# Patient Record
Sex: Male | Born: 1935 | Race: White | Hispanic: No | Marital: Married | Smoking: Former smoker
Health system: Southern US, Community
[De-identification: ages and names within clinical notes are randomized; demographics above are authoritative.]

## PROBLEM LIST (undated history)

## (undated) DIAGNOSIS — E785 Hyperlipidemia, unspecified: Secondary | ICD-10-CM

## (undated) DIAGNOSIS — K469 Unspecified abdominal hernia without obstruction or gangrene: Secondary | ICD-10-CM

## (undated) DIAGNOSIS — Z5189 Encounter for other specified aftercare: Secondary | ICD-10-CM

## (undated) DIAGNOSIS — I2699 Other pulmonary embolism without acute cor pulmonale: Secondary | ICD-10-CM

## (undated) DIAGNOSIS — M1990 Unspecified osteoarthritis, unspecified site: Secondary | ICD-10-CM

## (undated) DIAGNOSIS — R9439 Abnormal result of other cardiovascular function study: Principal | ICD-10-CM

## (undated) DIAGNOSIS — Z9289 Personal history of other medical treatment: Secondary | ICD-10-CM

## (undated) DIAGNOSIS — C641 Malignant neoplasm of right kidney, except renal pelvis: Secondary | ICD-10-CM

## (undated) DIAGNOSIS — I10 Essential (primary) hypertension: Secondary | ICD-10-CM

## (undated) HISTORY — PX: HERNIA REPAIR: SHX51

## (undated) HISTORY — DX: Malignant neoplasm of right kidney, except renal pelvis: C64.1

## (undated) HISTORY — PX: DOPPLER ECHOCARDIOGRAPHY: SHX263

## (undated) HISTORY — DX: Unspecified abdominal hernia without obstruction or gangrene: K46.9

## (undated) HISTORY — PX: LUNG LOBECTOMY: SHX167

## (undated) HISTORY — PX: OTHER SURGICAL HISTORY: SHX169

## (undated) HISTORY — PX: TOTAL KNEE ARTHROPLASTY: SHX125

## (undated) HISTORY — DX: Essential (primary) hypertension: I10

## (undated) HISTORY — DX: Encounter for other specified aftercare: Z51.89

## (undated) HISTORY — DX: Unspecified osteoarthritis, unspecified site: M19.90

## (undated) HISTORY — DX: Hyperlipidemia, unspecified: E78.5

## (undated) HISTORY — DX: Personal history of other medical treatment: Z92.89

## (undated) HISTORY — PX: BOWEL RESECTION: SHX1257

## (undated) HISTORY — DX: Other pulmonary embolism without acute cor pulmonale: I26.99

## (undated) HISTORY — PX: CHOLECYSTECTOMY: SHX55

## (undated) HISTORY — DX: Abnormal result of other cardiovascular function study: R94.39

---

## 1994-02-22 DIAGNOSIS — C641 Malignant neoplasm of right kidney, except renal pelvis: Secondary | ICD-10-CM

## 1994-02-22 HISTORY — DX: Malignant neoplasm of right kidney, except renal pelvis: C64.1

## 1994-02-22 HISTORY — PX: NEPHRECTOMY RADICAL: SUR878

## 1999-06-03 ENCOUNTER — Encounter: Payer: Self-pay | Admitting: *Deleted

## 1999-06-03 ENCOUNTER — Encounter: Admission: RE | Admit: 1999-06-03 | Discharge: 1999-06-03 | Payer: Self-pay | Admitting: *Deleted

## 1999-06-10 ENCOUNTER — Encounter: Payer: Self-pay | Admitting: Family Medicine

## 1999-06-10 ENCOUNTER — Encounter: Admission: RE | Admit: 1999-06-10 | Discharge: 1999-06-10 | Payer: Self-pay | Admitting: Family Medicine

## 2000-04-29 ENCOUNTER — Encounter: Payer: Self-pay | Admitting: *Deleted

## 2000-04-29 ENCOUNTER — Encounter: Admission: RE | Admit: 2000-04-29 | Discharge: 2000-04-29 | Payer: Self-pay | Admitting: Pediatrics

## 2001-01-09 ENCOUNTER — Encounter: Admission: RE | Admit: 2001-01-09 | Discharge: 2001-01-09 | Payer: Self-pay | Admitting: Nephrology

## 2001-01-09 ENCOUNTER — Encounter: Payer: Self-pay | Admitting: Nephrology

## 2001-01-23 ENCOUNTER — Ambulatory Visit (HOSPITAL_COMMUNITY): Admission: RE | Admit: 2001-01-23 | Discharge: 2001-01-23 | Payer: Self-pay | Admitting: Nephrology

## 2001-01-24 ENCOUNTER — Encounter: Admission: RE | Admit: 2001-01-24 | Discharge: 2001-01-24 | Payer: Self-pay | Admitting: Psychiatry

## 2001-01-24 ENCOUNTER — Encounter: Payer: Self-pay | Admitting: Psychiatry

## 2001-05-05 ENCOUNTER — Encounter: Payer: Self-pay | Admitting: *Deleted

## 2001-05-05 ENCOUNTER — Encounter: Admission: RE | Admit: 2001-05-05 | Discharge: 2001-05-05 | Payer: Self-pay | Admitting: *Deleted

## 2001-06-09 ENCOUNTER — Encounter: Payer: Self-pay | Admitting: Emergency Medicine

## 2001-06-09 ENCOUNTER — Inpatient Hospital Stay (HOSPITAL_COMMUNITY): Admission: EM | Admit: 2001-06-09 | Discharge: 2001-06-22 | Payer: Self-pay | Admitting: Emergency Medicine

## 2001-06-09 ENCOUNTER — Encounter (INDEPENDENT_AMBULATORY_CARE_PROVIDER_SITE_OTHER): Payer: Self-pay | Admitting: Specialist

## 2001-06-18 ENCOUNTER — Encounter: Payer: Self-pay | Admitting: Surgery

## 2001-06-19 ENCOUNTER — Encounter: Payer: Self-pay | Admitting: Surgery

## 2001-06-23 ENCOUNTER — Encounter: Admission: RE | Admit: 2001-06-23 | Discharge: 2001-06-23 | Payer: Self-pay | Admitting: *Deleted

## 2001-07-18 ENCOUNTER — Inpatient Hospital Stay (HOSPITAL_COMMUNITY): Admission: EM | Admit: 2001-07-18 | Discharge: 2001-07-24 | Payer: Self-pay | Admitting: Emergency Medicine

## 2001-07-19 ENCOUNTER — Encounter: Payer: Self-pay | Admitting: Gastroenterology

## 2001-07-22 ENCOUNTER — Encounter: Payer: Self-pay | Admitting: Surgery

## 2001-11-29 ENCOUNTER — Inpatient Hospital Stay (HOSPITAL_COMMUNITY): Admission: RE | Admit: 2001-11-29 | Discharge: 2001-12-04 | Payer: Self-pay | Admitting: *Deleted

## 2001-11-29 ENCOUNTER — Encounter (INDEPENDENT_AMBULATORY_CARE_PROVIDER_SITE_OTHER): Payer: Self-pay | Admitting: Specialist

## 2002-06-06 ENCOUNTER — Inpatient Hospital Stay (HOSPITAL_COMMUNITY): Admission: RE | Admit: 2002-06-06 | Discharge: 2002-06-11 | Payer: Self-pay | Admitting: *Deleted

## 2002-06-10 ENCOUNTER — Encounter: Payer: Self-pay | Admitting: General Surgery

## 2002-06-14 ENCOUNTER — Ambulatory Visit (HOSPITAL_COMMUNITY): Admission: RE | Admit: 2002-06-14 | Discharge: 2002-06-14 | Payer: Self-pay | Admitting: *Deleted

## 2002-06-25 ENCOUNTER — Encounter: Payer: Self-pay | Admitting: *Deleted

## 2002-06-25 ENCOUNTER — Inpatient Hospital Stay (HOSPITAL_COMMUNITY): Admission: EM | Admit: 2002-06-25 | Discharge: 2002-06-29 | Payer: Self-pay | Admitting: *Deleted

## 2002-06-27 ENCOUNTER — Encounter: Payer: Self-pay | Admitting: Internal Medicine

## 2002-07-05 ENCOUNTER — Ambulatory Visit (HOSPITAL_COMMUNITY): Admission: RE | Admit: 2002-07-05 | Discharge: 2002-07-05 | Payer: Self-pay | Admitting: *Deleted

## 2002-07-12 ENCOUNTER — Ambulatory Visit (HOSPITAL_COMMUNITY): Admission: RE | Admit: 2002-07-12 | Discharge: 2002-07-12 | Payer: Self-pay | Admitting: *Deleted

## 2002-07-19 ENCOUNTER — Ambulatory Visit (HOSPITAL_COMMUNITY): Admission: RE | Admit: 2002-07-19 | Discharge: 2002-07-19 | Payer: Self-pay | Admitting: *Deleted

## 2002-11-28 ENCOUNTER — Encounter (INDEPENDENT_AMBULATORY_CARE_PROVIDER_SITE_OTHER): Payer: Self-pay | Admitting: Specialist

## 2002-11-28 ENCOUNTER — Ambulatory Visit (HOSPITAL_COMMUNITY): Admission: RE | Admit: 2002-11-28 | Discharge: 2002-11-28 | Payer: Self-pay | Admitting: Gastroenterology

## 2003-04-29 ENCOUNTER — Encounter: Admission: RE | Admit: 2003-04-29 | Discharge: 2003-04-29 | Payer: Self-pay | Admitting: Specialist

## 2003-06-05 ENCOUNTER — Encounter: Admission: RE | Admit: 2003-06-05 | Discharge: 2003-06-05 | Payer: Self-pay | Admitting: Specialist

## 2004-04-03 ENCOUNTER — Encounter: Admission: RE | Admit: 2004-04-03 | Discharge: 2004-04-03 | Payer: Self-pay | Admitting: Orthopaedic Surgery

## 2004-05-29 ENCOUNTER — Ambulatory Visit (HOSPITAL_COMMUNITY): Admission: RE | Admit: 2004-05-29 | Discharge: 2004-05-29 | Payer: Self-pay | Admitting: Family Medicine

## 2004-07-28 ENCOUNTER — Inpatient Hospital Stay (HOSPITAL_COMMUNITY): Admission: RE | Admit: 2004-07-28 | Discharge: 2004-08-04 | Payer: Self-pay | Admitting: Specialist

## 2004-07-28 ENCOUNTER — Encounter (INDEPENDENT_AMBULATORY_CARE_PROVIDER_SITE_OTHER): Payer: Self-pay | Admitting: *Deleted

## 2004-07-28 ENCOUNTER — Ambulatory Visit: Payer: Self-pay | Admitting: Physical Medicine & Rehabilitation

## 2005-05-19 ENCOUNTER — Ambulatory Visit (HOSPITAL_COMMUNITY): Admission: RE | Admit: 2005-05-19 | Discharge: 2005-05-19 | Payer: Self-pay | Admitting: *Deleted

## 2005-11-07 ENCOUNTER — Inpatient Hospital Stay (HOSPITAL_COMMUNITY): Admission: EM | Admit: 2005-11-07 | Discharge: 2005-11-12 | Payer: Self-pay | Admitting: Emergency Medicine

## 2005-11-07 ENCOUNTER — Encounter (INDEPENDENT_AMBULATORY_CARE_PROVIDER_SITE_OTHER): Payer: Self-pay | Admitting: *Deleted

## 2005-11-20 ENCOUNTER — Inpatient Hospital Stay (HOSPITAL_COMMUNITY): Admission: EM | Admit: 2005-11-20 | Discharge: 2005-11-22 | Payer: Self-pay | Admitting: Emergency Medicine

## 2006-06-06 ENCOUNTER — Encounter: Admission: RE | Admit: 2006-06-06 | Discharge: 2006-06-06 | Payer: Self-pay | Admitting: Gastroenterology

## 2007-10-19 ENCOUNTER — Inpatient Hospital Stay (HOSPITAL_COMMUNITY): Admission: RE | Admit: 2007-10-19 | Discharge: 2007-10-23 | Payer: Self-pay | Admitting: Specialist

## 2007-10-27 ENCOUNTER — Ambulatory Visit: Payer: Self-pay | Admitting: Cardiology

## 2007-10-27 ENCOUNTER — Ambulatory Visit: Admission: RE | Admit: 2007-10-27 | Discharge: 2007-10-27 | Payer: Self-pay | Admitting: Specialist

## 2007-10-27 ENCOUNTER — Inpatient Hospital Stay (HOSPITAL_COMMUNITY): Admission: EM | Admit: 2007-10-27 | Discharge: 2007-11-01 | Payer: Self-pay | Admitting: Family Medicine

## 2007-10-27 ENCOUNTER — Ambulatory Visit: Payer: Self-pay | Admitting: Vascular Surgery

## 2007-10-27 ENCOUNTER — Encounter (INDEPENDENT_AMBULATORY_CARE_PROVIDER_SITE_OTHER): Payer: Self-pay | Admitting: Specialist

## 2007-10-31 ENCOUNTER — Encounter (INDEPENDENT_AMBULATORY_CARE_PROVIDER_SITE_OTHER): Payer: Self-pay | Admitting: Internal Medicine

## 2007-11-07 ENCOUNTER — Emergency Department (HOSPITAL_COMMUNITY): Admission: EM | Admit: 2007-11-07 | Discharge: 2007-11-07 | Payer: Self-pay | Admitting: Emergency Medicine

## 2007-11-07 ENCOUNTER — Encounter (INDEPENDENT_AMBULATORY_CARE_PROVIDER_SITE_OTHER): Payer: Self-pay | Admitting: Specialist

## 2007-11-09 ENCOUNTER — Ambulatory Visit: Payer: Self-pay | Admitting: *Deleted

## 2007-11-09 ENCOUNTER — Ambulatory Visit: Admission: RE | Admit: 2007-11-09 | Discharge: 2007-11-09 | Payer: Self-pay | Admitting: Specialist

## 2007-11-09 ENCOUNTER — Encounter (INDEPENDENT_AMBULATORY_CARE_PROVIDER_SITE_OTHER): Payer: Self-pay | Admitting: Specialist

## 2007-11-10 ENCOUNTER — Ambulatory Visit: Payer: Self-pay | Admitting: Vascular Surgery

## 2009-03-07 ENCOUNTER — Ambulatory Visit: Payer: Self-pay | Admitting: Surgery

## 2009-03-07 ENCOUNTER — Ambulatory Visit: Admission: RE | Admit: 2009-03-07 | Discharge: 2009-03-07 | Payer: Self-pay | Admitting: Specialist

## 2009-03-07 ENCOUNTER — Encounter (INDEPENDENT_AMBULATORY_CARE_PROVIDER_SITE_OTHER): Payer: Self-pay | Admitting: Specialist

## 2009-03-10 ENCOUNTER — Encounter: Admission: RE | Admit: 2009-03-10 | Discharge: 2009-03-10 | Payer: Self-pay | Admitting: Gastroenterology

## 2009-03-13 ENCOUNTER — Ambulatory Visit (HOSPITAL_COMMUNITY): Admission: RE | Admit: 2009-03-13 | Discharge: 2009-03-13 | Payer: Self-pay | Admitting: Gastroenterology

## 2009-03-17 ENCOUNTER — Encounter: Admission: RE | Admit: 2009-03-17 | Discharge: 2009-03-17 | Payer: Self-pay | Admitting: Gastroenterology

## 2009-04-10 ENCOUNTER — Encounter (HOSPITAL_COMMUNITY): Admission: RE | Admit: 2009-04-10 | Discharge: 2009-06-24 | Payer: Self-pay | Admitting: Specialist

## 2009-06-30 ENCOUNTER — Inpatient Hospital Stay (HOSPITAL_COMMUNITY): Admission: AD | Admit: 2009-06-30 | Discharge: 2009-07-02 | Payer: Self-pay | Admitting: Surgery

## 2009-06-30 ENCOUNTER — Encounter (INDEPENDENT_AMBULATORY_CARE_PROVIDER_SITE_OTHER): Payer: Self-pay | Admitting: Surgery

## 2009-09-18 ENCOUNTER — Encounter: Admission: RE | Admit: 2009-09-18 | Discharge: 2009-09-18 | Payer: Self-pay | Admitting: Gastroenterology

## 2009-10-22 ENCOUNTER — Encounter: Admission: RE | Admit: 2009-10-22 | Discharge: 2009-10-22 | Payer: Self-pay | Admitting: Internal Medicine

## 2009-10-22 ENCOUNTER — Other Ambulatory Visit: Admission: RE | Admit: 2009-10-22 | Discharge: 2009-10-22 | Payer: Self-pay | Admitting: Interventional Radiology

## 2010-02-09 ENCOUNTER — Inpatient Hospital Stay (HOSPITAL_COMMUNITY): Admission: EM | Admit: 2010-02-09 | Discharge: 2010-02-11 | Payer: Self-pay | Source: Home / Self Care

## 2010-03-14 ENCOUNTER — Encounter: Payer: Self-pay | Admitting: Orthopaedic Surgery

## 2010-03-15 ENCOUNTER — Encounter: Payer: Self-pay | Admitting: Gastroenterology

## 2010-03-23 LAB — DIFFERENTIAL
Eosinophils Relative: 5 % (ref 0–5)
Lymphs Abs: 2 10*3/uL (ref 0.7–4.0)
Monocytes Absolute: 0.6 10*3/uL (ref 0.1–1.0)
Monocytes Relative: 9 % (ref 3–12)
Neutro Abs: 4.1 10*3/uL (ref 1.7–7.7)

## 2010-03-23 LAB — URINALYSIS, ROUTINE W REFLEX MICROSCOPIC
Hgb urine dipstick: NEGATIVE
Protein, ur: NEGATIVE mg/dL
Specific Gravity, Urine: 1.025 (ref 1.005–1.030)
Urine Glucose, Fasting: NEGATIVE mg/dL
pH: 5 (ref 5.0–8.0)

## 2010-03-23 LAB — BASIC METABOLIC PANEL
BUN: 31 mg/dL — ABNORMAL HIGH (ref 6–23)
CO2: 23 mEq/L (ref 19–32)
Chloride: 109 mEq/L (ref 96–112)
Creatinine, Ser: 1.56 mg/dL — ABNORMAL HIGH (ref 0.4–1.5)
Glucose, Bld: 106 mg/dL — ABNORMAL HIGH (ref 70–99)
Potassium: 4.7 mEq/L (ref 3.5–5.1)

## 2010-03-23 LAB — CBC
Hemoglobin: 15.1 g/dL (ref 13.0–17.0)
MCH: 32.3 pg (ref 26.0–34.0)
MCHC: 33.7 g/dL (ref 30.0–36.0)
MCV: 95.9 fL (ref 78.0–100.0)
RBC: 4.67 MIL/uL (ref 4.22–5.81)

## 2010-03-23 LAB — PROTIME-INR: Prothrombin Time: 14.3 seconds (ref 11.6–15.2)

## 2010-03-25 DIAGNOSIS — I2699 Other pulmonary embolism without acute cor pulmonale: Secondary | ICD-10-CM

## 2010-03-25 HISTORY — DX: Other pulmonary embolism without acute cor pulmonale: I26.99

## 2010-03-31 ENCOUNTER — Inpatient Hospital Stay (HOSPITAL_COMMUNITY)
Admission: RE | Admit: 2010-03-31 | Discharge: 2010-04-10 | DRG: 353 | Disposition: A | Discharge status: Home / Self Care | Payer: Medicare Other | Attending: Surgery | Admitting: Surgery

## 2010-03-31 DIAGNOSIS — N183 Chronic kidney disease, stage 3 unspecified: Secondary | ICD-10-CM | POA: Diagnosis present

## 2010-03-31 DIAGNOSIS — E781 Pure hyperglyceridemia: Secondary | ICD-10-CM | POA: Diagnosis present

## 2010-03-31 DIAGNOSIS — E669 Obesity, unspecified: Secondary | ICD-10-CM | POA: Diagnosis present

## 2010-03-31 DIAGNOSIS — M109 Gout, unspecified: Secondary | ICD-10-CM | POA: Diagnosis present

## 2010-03-31 DIAGNOSIS — K219 Gastro-esophageal reflux disease without esophagitis: Secondary | ICD-10-CM | POA: Diagnosis present

## 2010-03-31 DIAGNOSIS — K560 Paralytic ileus: Secondary | ICD-10-CM | POA: Diagnosis not present

## 2010-03-31 DIAGNOSIS — K432 Incisional hernia without obstruction or gangrene: Principal | ICD-10-CM | POA: Diagnosis present

## 2010-03-31 DIAGNOSIS — D649 Anemia, unspecified: Secondary | ICD-10-CM | POA: Diagnosis not present

## 2010-03-31 DIAGNOSIS — Z86718 Personal history of other venous thrombosis and embolism: Secondary | ICD-10-CM

## 2010-03-31 DIAGNOSIS — I2699 Other pulmonary embolism without acute cor pulmonale: Secondary | ICD-10-CM | POA: Diagnosis not present

## 2010-03-31 DIAGNOSIS — R0902 Hypoxemia: Secondary | ICD-10-CM | POA: Diagnosis not present

## 2010-03-31 DIAGNOSIS — Z9049 Acquired absence of other specified parts of digestive tract: Secondary | ICD-10-CM

## 2010-03-31 DIAGNOSIS — I129 Hypertensive chronic kidney disease with stage 1 through stage 4 chronic kidney disease, or unspecified chronic kidney disease: Secondary | ICD-10-CM | POA: Diagnosis present

## 2010-03-31 DIAGNOSIS — I824Z9 Acute embolism and thrombosis of unspecified deep veins of unspecified distal lower extremity: Secondary | ICD-10-CM | POA: Diagnosis not present

## 2010-03-31 LAB — TYPE AND SCREEN
ABO/RH(D): AB POS
Antibody Screen: NEGATIVE

## 2010-04-03 ENCOUNTER — Inpatient Hospital Stay (HOSPITAL_COMMUNITY): Payer: Medicare Other

## 2010-04-03 LAB — CK TOTAL AND CKMB (NOT AT ARMC)
CK, MB: 1.9 ng/mL (ref 0.3–4.0)
Total CK: 76 U/L (ref 7–232)

## 2010-04-04 ENCOUNTER — Inpatient Hospital Stay (HOSPITAL_COMMUNITY): Payer: Medicare Other

## 2010-04-04 ENCOUNTER — Encounter (HOSPITAL_COMMUNITY): Payer: Self-pay | Admitting: Surgery

## 2010-04-04 DIAGNOSIS — I369 Nonrheumatic tricuspid valve disorder, unspecified: Secondary | ICD-10-CM

## 2010-04-04 LAB — CK TOTAL AND CKMB (NOT AT ARMC)
CK, MB: 1.2 ng/mL (ref 0.3–4.0)
Relative Index: INVALID (ref 0.0–2.5)

## 2010-04-04 LAB — CBC
HCT: 33.9 % — ABNORMAL LOW (ref 39.0–52.0)
MCH: 31.9 pg (ref 26.0–34.0)
MCV: 95 fL (ref 78.0–100.0)
Platelets: 275 10*3/uL (ref 150–400)
RDW: 14.1 % (ref 11.5–15.5)
WBC: 9.1 10*3/uL (ref 4.0–10.5)

## 2010-04-04 LAB — BASIC METABOLIC PANEL
BUN: 18 mg/dL (ref 6–23)
CO2: 24 mEq/L (ref 19–32)
Calcium: 9.6 mg/dL (ref 8.4–10.5)
Chloride: 105 mEq/L (ref 96–112)
Creatinine, Ser: 1.83 mg/dL — ABNORMAL HIGH (ref 0.4–1.5)

## 2010-04-04 LAB — PROTIME-INR
INR: 1.17 (ref 0.00–1.49)
Prothrombin Time: 15.1 seconds (ref 11.6–15.2)

## 2010-04-04 LAB — TROPONIN I: Troponin I: 0.08 ng/mL — ABNORMAL HIGH (ref 0.00–0.06)

## 2010-04-04 LAB — BRAIN NATRIURETIC PEPTIDE: Pro B Natriuretic peptide (BNP): 30 pg/mL (ref 0.0–100.0)

## 2010-04-04 LAB — D-DIMER, QUANTITATIVE: D-Dimer, Quant: 3.71 ug/mL-FEU — ABNORMAL HIGH (ref 0.00–0.48)

## 2010-04-04 MED ORDER — XENON XE 133 GAS
9.0000 | GAS_FOR_INHALATION | Freq: Once | RESPIRATORY_TRACT | Status: AC | PRN
  Administered 2010-04-04: 9 via RESPIRATORY_TRACT

## 2010-04-04 MED ORDER — TECHNETIUM TO 99M ALBUMIN AGGREGATED
4.0000 | Freq: Once | INTRAVENOUS | Status: AC | PRN
  Administered 2010-04-04: 4 via INTRAVENOUS

## 2010-04-05 LAB — CBC
MCH: 32.7 pg (ref 26.0–34.0)
MCHC: 34.7 g/dL (ref 30.0–36.0)
Platelets: 264 10*3/uL (ref 150–400)
RBC: 3.49 MIL/uL — ABNORMAL LOW (ref 4.22–5.81)

## 2010-04-05 LAB — HEPARIN LEVEL (UNFRACTIONATED): Heparin Unfractionated: 0.1 IU/mL — ABNORMAL LOW (ref 0.30–0.70)

## 2010-04-06 DIAGNOSIS — R0602 Shortness of breath: Secondary | ICD-10-CM

## 2010-04-06 LAB — COMPREHENSIVE METABOLIC PANEL
BUN: 31 mg/dL — ABNORMAL HIGH (ref 6–23)
CO2: 24 mEq/L (ref 19–32)
Chloride: 106 mEq/L (ref 96–112)
Creatinine, Ser: 1.59 mg/dL — ABNORMAL HIGH (ref 0.4–1.5)
GFR calc non Af Amer: 43 mL/min — ABNORMAL LOW (ref >60)
Glucose, Bld: 133 mg/dL — ABNORMAL HIGH (ref 70–99)
Total Bilirubin: 0.6 mg/dL (ref 0.3–1.2)

## 2010-04-06 LAB — TYPE AND SCREEN
ABO/RH(D): AB POS
Antibody Screen: NEGATIVE

## 2010-04-06 LAB — CBC
MCH: 32.3 pg (ref 26.0–34.0)
MCV: 95.4 fL (ref 78.0–100.0)
Platelets: 339 10*3/uL (ref 150–400)
RDW: 13.9 % (ref 11.5–15.5)
WBC: 9 10*3/uL (ref 4.0–10.5)

## 2010-04-07 LAB — PROTIME-INR: Prothrombin Time: 16.9 seconds — ABNORMAL HIGH (ref 11.6–15.2)

## 2010-04-07 LAB — COMPREHENSIVE METABOLIC PANEL
Alkaline Phosphatase: 46 U/L (ref 39–117)
BUN: 34 mg/dL — ABNORMAL HIGH (ref 6–23)
CO2: 23 mEq/L (ref 19–32)
GFR calc non Af Amer: 41 mL/min — ABNORMAL LOW (ref >60)
Glucose, Bld: 126 mg/dL — ABNORMAL HIGH (ref 70–99)
Potassium: 4 mEq/L (ref 3.5–5.1)
Total Protein: 6.1 g/dL (ref 6.0–8.3)

## 2010-04-07 LAB — HEPARIN LEVEL (UNFRACTIONATED): Heparin Unfractionated: 0.51 IU/mL (ref 0.30–0.70)

## 2010-04-07 LAB — CBC
MCHC: 34 g/dL (ref 30.0–36.0)
RDW: 14 % (ref 11.5–15.5)

## 2010-04-08 LAB — CBC
MCV: 95 fL (ref 78.0–100.0)
Platelets: 297 10*3/uL (ref 150–400)
RBC: 3.39 MIL/uL — ABNORMAL LOW (ref 4.22–5.81)
WBC: 7.8 10*3/uL (ref 4.0–10.5)

## 2010-04-08 LAB — COMPREHENSIVE METABOLIC PANEL
Albumin: 2.6 g/dL — ABNORMAL LOW (ref 3.5–5.2)
BUN: 32 mg/dL — ABNORMAL HIGH (ref 6–23)
Chloride: 111 mEq/L (ref 96–112)
Creatinine, Ser: 1.65 mg/dL — ABNORMAL HIGH (ref 0.4–1.5)
Total Bilirubin: 0.6 mg/dL (ref 0.3–1.2)

## 2010-04-08 LAB — CARDIAC PANEL(CRET KIN+CKTOT+MB+TROPI)
Relative Index: INVALID (ref 0.0–2.5)
Total CK: 41 U/L (ref 7–232)
Troponin I: <0.01 ng/mL (ref 0.00–0.06)

## 2010-04-08 LAB — HEPARIN LEVEL (UNFRACTIONATED): Heparin Unfractionated: 0.68 IU/mL (ref 0.30–0.70)

## 2010-04-08 LAB — PROTIME-INR
INR: 1.55 — ABNORMAL HIGH (ref 0.00–1.49)
Prothrombin Time: 18.8 seconds — ABNORMAL HIGH (ref 11.6–15.2)

## 2010-04-09 LAB — HEPARIN LEVEL (UNFRACTIONATED): Heparin Unfractionated: 0.64 IU/mL (ref 0.30–0.70)

## 2010-04-09 LAB — CBC
MCH: 31.6 pg (ref 26.0–34.0)
MCHC: 33.1 g/dL (ref 30.0–36.0)
Platelets: 323 10*3/uL (ref 150–400)
RBC: 3.45 MIL/uL — ABNORMAL LOW (ref 4.22–5.81)
RDW: 14.1 % (ref 11.5–15.5)

## 2010-04-09 LAB — COMPREHENSIVE METABOLIC PANEL
ALT: 14 U/L (ref 0–53)
Alkaline Phosphatase: 41 U/L (ref 39–117)
BUN: 24 mg/dL — ABNORMAL HIGH (ref 6–23)
CO2: 24 mEq/L (ref 19–32)
Chloride: 112 mEq/L (ref 96–112)
Glucose, Bld: 125 mg/dL — ABNORMAL HIGH (ref 70–99)
Potassium: 3.9 mEq/L (ref 3.5–5.1)
Total Bilirubin: 0.5 mg/dL (ref 0.3–1.2)

## 2010-04-09 LAB — PROTIME-INR
INR: 2.18 — ABNORMAL HIGH (ref 0.00–1.49)
Prothrombin Time: 24.4 seconds — ABNORMAL HIGH (ref 11.6–15.2)

## 2010-04-10 LAB — CBC
MCH: 31.8 pg (ref 26.0–34.0)
MCHC: 32.9 g/dL (ref 30.0–36.0)
RDW: 14.1 % (ref 11.5–15.5)

## 2010-04-10 LAB — HEPARIN LEVEL (UNFRACTIONATED): Heparin Unfractionated: 0.71 IU/mL — ABNORMAL HIGH (ref 0.30–0.70)

## 2010-04-10 LAB — COMPREHENSIVE METABOLIC PANEL
ALT: 15 U/L (ref 0–53)
AST: 17 U/L (ref 0–37)
Calcium: 9.3 mg/dL (ref 8.4–10.5)
Creatinine, Ser: 1.5 mg/dL (ref 0.4–1.5)
GFR calc Af Amer: 56 mL/min — ABNORMAL LOW (ref >60)
GFR calc non Af Amer: 46 mL/min — ABNORMAL LOW (ref >60)
Glucose, Bld: 118 mg/dL — ABNORMAL HIGH (ref 70–99)
Sodium: 141 mEq/L (ref 135–145)
Total Protein: 6 g/dL (ref 6.0–8.3)

## 2010-04-10 LAB — PROTIME-INR: INR: 2.61 — ABNORMAL HIGH (ref 0.00–1.49)

## 2010-04-16 NOTE — Op Note (Signed)
[name], [name]               ACCOUNT NO.:  [id number]  MEDICAL RECORD NO.:  [id number]           PATIENT TYPE:  I  LOCATION:  0005                         FACILITY:  [facility]  PHYSICIAN:  Velora Heckler, MD      DATE OF BIRTH:  [birth year]  DATE OF PROCEDURE:  03/31/2010 DATE OF DISCHARGE:                              OPERATIVE REPORT   PREOPERATIVE DIAGNOSIS:  Recurrent ventral incisional hernia.  POSTOPERATIVE DIAGNOSIS:  Recurrent ventral incisional hernia.  PROCEDURE:  Repair of recurrent ventral incisional hernia with mesh.  SURGEON:  Velora Heckler, MD  ASSISTANT:  Angelia Mould. Derrell Lolling, M.D.  ANESTHESIA:  General.  ESTIMATED BLOOD LOSS:  150 cc.  PREPARATION:  ChloraPrep.  COMPLICATIONS:  None.  INDICATIONS:  The patient is a 76 year old white male well known to our surgical practice.  He had originally presented in 2003 with perforated sigmoid diverticulitis.  He underwent resection and colostomy.  He underwent colostomy closure in October 2003.  The patient developed a ventral incisional hernia which was repaired in 2004 by Dr. Simona Huh.  He again developed a recurrence in 2007 and underwent repair with mesh by Dr. Simona Huh.  The patient had an open cholecystectomy in May 2011 by myself.  The patient was admitted on the surgical service in December 2011 with small-bowel obstruction due to an incarcerated ventral hernia. This resolved with medical management.  The patient now comes to the operating room for repair of recurrent ventral incisional hernia.  BODY OF REPORT:  The procedure was done in OR #6 at the [facility].  The patient was brought to the operating room and placed in the supine position on the operating room table.  Following the administration of general anesthesia, the patient was prepped and draped in the usual strict aseptic fashion.  After ascertaining that an adequate level of anesthesia had been achieved, the patient's  previous midline abdominal scar was excised.  Dissection was carried into the subcutaneous tissues.  Suture material was extracted.  In the epigastrium, the peritoneal cavity was entered.  Midline incision was reopened.  Hernia was reduced.  Adhesions were taken down with sharp dissection.  This midline incision was opened to well below the level of the umbilicus by dividing through the, what appears to be, polyester mesh.  Next, lysis of adhesions to the anterior abdominal wall was performed circumferentially, allowing for complete mobilization of the abdominal wall.  Good hemostasis was achieved.  Subcutaneous skin flaps were developed circumferentially.  Skin flaps were raised widely up and onto the thorax over the lower ribs and laterally to the anterior axillary line bilaterally and inferiorly to well below the level of the umbilicus.  Hernia sac was excised from the subcutaneous tissues on the left.  Previous polyester mesh was excised from the left side of the abdominal wall.  Relaxing incisions were made in the external oblique fascia bilaterally. This allows for good mobilization of the midline structures so that they can be approximated without undue tension.  After assuring that good hemostasis was achieved intra-abdominally and that all packs had been removed, the midline fascial incision  was closed with interrupted #1 Novafil simple sutures.  There was good approximation of the tissues without tension.  Next a 10 inches x 14 inches sheet of polypropylene mesh was utilized to cover the midline fascial closure as well as the bilateral relaxing incisions.  Mesh was trimmed slightly.  It was secured to the anterior abdominal wall with interrupted 0 Novafil sutures.  Mesh approximates the abdominal wall well.  Two 19-French Blake drains were brought in from inferior stab wounds and placed anterior to the mesh in the subcutaneous space.  They were secured to the skin with 0  Novafil sutures.  Subcutaneous tissues were reapproximated in the midline with interrupted 2-0 Vicryl sutures.  Skin was closed with stainless steel staples.  Drains were placed to bulb suction.  Sterile dressings were applied.  A 12-inches abdominal binder was placed around the torso.  The patient was awakened from anesthesia and brought to the recovery room in stable condition.  The patient tolerated the procedure well.     Velora Heckler, MD     TMG/MEDQ  D:  03/31/2010  T:  03/31/2010  Job:  [id number]  cc:   Velora Heckler, MD [address] [place] [address]  Gwen Pounds, MD Fax: [phone]  Everardo All. Madilyn Fireman, M.D. Fax: [phone]  Electronically Signed by Darnell Level MD on 04/16/2010 10:34:43 AM

## 2010-05-04 LAB — URINALYSIS, ROUTINE W REFLEX MICROSCOPIC
Bilirubin Urine: NEGATIVE
Glucose, UA: NEGATIVE mg/dL
Hgb urine dipstick: NEGATIVE
Protein, ur: NEGATIVE mg/dL
Urobilinogen, UA: 0.2 mg/dL (ref 0.0–1.0)

## 2010-05-04 LAB — CBC
Hemoglobin: 14.3 g/dL (ref 13.0–17.0)
MCH: 32.6 pg (ref 26.0–34.0)
MCH: 33.3 pg (ref 26.0–34.0)
MCHC: 34 g/dL (ref 30.0–36.0)
MCV: 95.7 fL (ref 78.0–100.0)
MCV: 97.9 fL (ref 78.0–100.0)
MCV: 99.5 fL (ref 78.0–100.0)
Platelets: 334 10*3/uL (ref 150–400)
Platelets: 385 10*3/uL (ref 150–400)
Platelets: 438 10*3/uL — ABNORMAL HIGH (ref 150–400)
RBC: 4.3 MIL/uL (ref 4.22–5.81)
RBC: 4.91 MIL/uL (ref 4.22–5.81)
RDW: 13.2 % (ref 11.5–15.5)
RDW: 13.5 % (ref 11.5–15.5)
WBC: 15.5 10*3/uL — ABNORMAL HIGH (ref 4.0–10.5)

## 2010-05-04 LAB — BASIC METABOLIC PANEL
BUN: 28 mg/dL — ABNORMAL HIGH (ref 6–23)
CO2: 31 mEq/L (ref 19–32)
CO2: 31 mEq/L (ref 19–32)
Calcium: 9.7 mg/dL (ref 8.4–10.5)
Chloride: 102 mEq/L (ref 96–112)
Chloride: 104 mEq/L (ref 96–112)
Creatinine, Ser: 1.69 mg/dL — ABNORMAL HIGH (ref 0.4–1.5)
Creatinine, Ser: 1.78 mg/dL — ABNORMAL HIGH (ref 0.4–1.5)
GFR calc Af Amer: 46 mL/min — ABNORMAL LOW (ref >60)
Glucose, Bld: 140 mg/dL — ABNORMAL HIGH (ref 70–99)

## 2010-05-04 LAB — COMPREHENSIVE METABOLIC PANEL
ALT: 23 U/L (ref 0–53)
AST: 33 U/L (ref 0–37)
Albumin: 4.2 g/dL (ref 3.5–5.2)
Alkaline Phosphatase: 43 U/L (ref 39–117)
BUN: 36 mg/dL — ABNORMAL HIGH (ref 6–23)
Chloride: 104 mEq/L (ref 96–112)
Potassium: 4.8 mEq/L (ref 3.5–5.1)
Sodium: 139 mEq/L (ref 135–145)
Total Bilirubin: 0.7 mg/dL (ref 0.3–1.2)
Total Protein: 8.3 g/dL (ref 6.0–8.3)

## 2010-05-04 LAB — DIFFERENTIAL
Basophils Absolute: 0.1 10*3/uL (ref 0.0–0.1)
Basophils Relative: 0 % (ref 0–1)
Eosinophils Absolute: 0.1 10*3/uL (ref 0.0–0.7)
Eosinophils Relative: 1 % (ref 0–5)
Monocytes Absolute: 0.8 10*3/uL (ref 0.1–1.0)
Monocytes Relative: 5 % (ref 3–12)

## 2010-05-10 LAB — COMPREHENSIVE METABOLIC PANEL
BUN: 17 mg/dL (ref 6–23)
CO2: 26 mEq/L (ref 19–32)
Calcium: 9.4 mg/dL (ref 8.4–10.5)
Creatinine, Ser: 0.9 mg/dL (ref 0.4–1.5)
GFR calc non Af Amer: >60 mL/min (ref >60)
Glucose, Bld: 128 mg/dL — ABNORMAL HIGH (ref 70–99)

## 2010-05-12 LAB — DIFFERENTIAL
Basophils Absolute: 0 10*3/uL (ref 0.0–0.1)
Basophils Relative: 1 % (ref 0–1)
Eosinophils Absolute: 0.4 10*3/uL (ref 0.0–0.7)
Neutrophils Relative %: 56 % (ref 43–77)

## 2010-05-12 LAB — PROTIME-INR
INR: 1.08 (ref 0.00–1.49)
Prothrombin Time: 13.9 seconds (ref 11.6–15.2)

## 2010-05-12 LAB — COMPREHENSIVE METABOLIC PANEL
ALT: 34 U/L (ref 0–53)
ALT: 75 U/L — ABNORMAL HIGH (ref 0–53)
Albumin: 3.8 g/dL (ref 3.5–5.2)
Alkaline Phosphatase: 37 U/L — ABNORMAL LOW (ref 39–117)
Alkaline Phosphatase: 44 U/L (ref 39–117)
BUN: 23 mg/dL (ref 6–23)
CO2: 24 mEq/L (ref 19–32)
Calcium: 9.5 mg/dL (ref 8.4–10.5)
Chloride: 103 mEq/L (ref 96–112)
GFR calc non Af Amer: 45 mL/min — ABNORMAL LOW (ref >60)
Glucose, Bld: 159 mg/dL — ABNORMAL HIGH (ref 70–99)
Glucose, Bld: 165 mg/dL — ABNORMAL HIGH (ref 70–99)
Potassium: 4.2 mEq/L (ref 3.5–5.1)
Potassium: 4.7 mEq/L (ref 3.5–5.1)
Sodium: 141 mEq/L (ref 135–145)
Total Bilirubin: 0.5 mg/dL (ref 0.3–1.2)
Total Bilirubin: 1 mg/dL (ref 0.3–1.2)

## 2010-05-12 LAB — URINALYSIS, ROUTINE W REFLEX MICROSCOPIC
Bilirubin Urine: NEGATIVE
Ketones, ur: NEGATIVE mg/dL
Nitrite: NEGATIVE
Urobilinogen, UA: 0.2 mg/dL (ref 0.0–1.0)

## 2010-05-12 LAB — CBC
HCT: 44.1 % (ref 39.0–52.0)
Hemoglobin: 15 g/dL (ref 13.0–17.0)
Platelets: 250 10*3/uL (ref 150–400)
RBC: 4.46 MIL/uL (ref 4.22–5.81)
WBC: 5.6 10*3/uL (ref 4.0–10.5)

## 2010-05-18 NOTE — Discharge Summary (Signed)
  [name], [name]               ACCOUNT NO.:  [id number]  MEDICAL RECORD NO.:  [id number]           PATIENT TYPE:  I  LOCATION:  1417                         FACILITY:  [facility]  PHYSICIAN:  Velora Heckler, MD      DATE OF BIRTH:  1935-01-22  DATE OF ADMISSION:  03/31/2010 DATE OF DISCHARGE:  04/10/2010                              DISCHARGE SUMMARY   REASON FOR ADMISSION:  Recurrent ventral incisional hernia.  BRIEF HISTORY:  The patient is a 76 year old white male well-known to [facility].  He had originally presented in 2003 with perforated sigmoid diverticulitis.  He underwent resection and colostomy.  He then had colostomy closure in October 2003.  He developed a ventral incisional hernia which was repaired by Dr. Baruch Merl in 2004.  He again developed a recurrence in 2007 and underwent repair with mesh by Dr. Colin Benton.  The patient required open cholecystectomy in May 2011.  In December 2011,  he was admitted with small bowel obstruction due to incarcerated ventral hernia which resolved with medical management.  The patient is now admitted for repair of recurrent ventral incisional hernia.  HOSPITAL COURSE:  The patient was admitted on the surgical service on March 31, 2010.  He was taken directly to the operating room where he underwent repair of recurrent ventral incisional hernia with mesh. Postoperative course was initially straightforward.  He advanced from clear liquids to a regular diet.  However, on April 03, 2010, the patient developed shortness of breath and tachycardia.  Workup revealed pulmonary embolism.  The patient was anticoagulated on heparin.  He was seen in consultation by his primary physician, Dr. Creola Corn.  The patient stabilized.  He made gradual slow progress.  He was placed on oral Coumadin therapy.  Levels slowly rose to therapeutic range.  The patient had no further complications from his abdominal surgery.  He was prepared  for discharge home on April 10, 2010.  DISCHARGE/PLAN:  The patient is discharged home on April 10, 2010 in good condition, tolerating a regular diet, and taking Coumadin for anticoagulation.  The patient will be seen back in my office at [facility] in 5 days for wound check and possible drain removal. Discharge medications include all of his usual home medications as per usual.  He is essentially taking Tylenol as needed for pain.  He is on Coumadin for assumed chronic anticoagulation.  FINAL DIAGNOSES: 1. Ventral incisional hernia. 2. Pulmonary embolism.  CONDITION ON DISCHARGE:  Good.     Velora Heckler, MD     TMG/MEDQ  D:  05/15/2010  T:  05/15/2010  Job:  [id number]  cc:   Gwen Pounds, MD Fax: [phone]  Electronically Signed by Darnell Level MD on 05/18/2010 01:24:40 PM

## 2010-07-03 ENCOUNTER — Encounter (INDEPENDENT_AMBULATORY_CARE_PROVIDER_SITE_OTHER): Payer: Self-pay | Admitting: Surgery

## 2010-07-07 NOTE — Op Note (Signed)
[name], FETTERS NO.:  [id number]   MEDICAL RECORD NO.:  [id number]          PATIENT TYPE:  INP   LOCATION:  0001                         FACILITY:  [facility]   PHYSICIAN:  Erasmo Leventhal, M.D.DATE OF BIRTH:  10/15/1934   DATE OF PROCEDURE:  10/19/2007  DATE OF DISCHARGE:                               OPERATIVE REPORT   PREOPERATIVE DIAGNOSIS:  Right knee end-stage osteoarthritis.   POSTOPERATIVE DIAGNOSIS:  Right knee end-stage osteoarthritis.   PROCEDURE:  Right total knee arthroplasty.   SURGEON:  Erasmo Leventhal, M.D.   ASSISTANT:  Jaquelyn Bitter. Chabon, P.A.   ANESTHESIA:  Spinal.   ESTIMATED BLOOD LOSS:  Less than 50 mL.   DRAINS:  One Hemovac.   COMPLICATIONS:  None.   TOURNIQUET TIME:  Was 80 minutes at 300 mmHg.   DISPOSITION:  PACU stable.   OPERATIVE IMPLANTS:  DePuy Sigma all cemented.  Size 5 femur, size 5  tibia, 50 mm posterior stabilized rotating platform tibial insert, 41 mm  __________ patella.   OPERATIVE DETAILS:  The patient was __________.  IV was started, taken  to the operating room where spinal anesthetic was administered and IV  antibiotics were given.  Foley catheter was placed with nonsterile  technique by the circulating nurse.  All extremities well-padded.  The  right knee was examined.  Five degree flexion contracture, flexion 110  degrees.  The knee was in varus.   Elevation and prepped and draped in sterile fashion.  Exsanguinated with  an Esmarch.  Tourniquet was inflated to 300 mmHg.  Straight midline  incision made through skin and subcutaneous tissue.  Midline soft tissue  flaps were developed.  The knee was flexed.  Medial peripatellar  arthrotomy was performed __________soft tissue release due to his varus  malalignment, end-stage arthritis changes, large bone spurs throughout  the entire knee and no articular cartilage remained of any significance.  Cruciate ligaments were resected.  Starting hole  made in the distal  femur __________planes.  I chose a 5 degree valgus cut and I took a 10  mm cut off the distal femur due to his flexion contracture.  His femur  was found to be a size #5 and rotation marks were set and a cutting  block was applied fitting a size #5.  Medial and lateral menisci were  __________ under direct visualization.  Geniculate vessels were  coagulated.  Osteophytes were removed.  __________was resected.  __________was found to be a size #5.  Starting hole was made and the  knee was irrigated.  __________ placed.  Neutral cut with a 10 mm cut  off the distal tibia __________osteophytes were removed.  __________12.5  was well-balanced.  The knee was then flexed.  __________ base plate was  applied.  Rotation cuts were set.  __________ was performed.  __________was applied.  __________ size 5 tibia, 12.5 insert and we had  excellent alignment and range of motion balance.  Patella was found to  be a size 41 and __________ cut was made.  __________ were removed.  The  knee was irrigated with pulsatile  lavage.  Utilizing __________technique  all components were cemented into place.  Size 5 femur, size 5 tibia, 50  mm trial placed and 41 patella fit.  After all cement had cured excess  cement was removed.  A 50 mm spacer had excellent alignment and clinical  balance and patellofemoral tracking.  Tray spacer was removed.  The knee  was irrigated.  A 50 mm posterior stabilized rotating platform tibial  insert was then implanted.  The knee was __________tracking perfectly.  __________ placed and the knee was again irrigated.  The knee was closed  __________ in 90 degrees of flexion with Vicryl suture in interrupted  figure-of-eight fashion.  Subcu Vicryl, skin with subcu Vicryl suture.  Steri-Strips were applied.  The drain was hooked to suction.  Tourniquet  was released.  Sterile dressing applied.  Tourniquet was deflated.  Normal circulation of the foot and ankle at the  end of the case.  There  were no complications or problems.  Excellent circulation of the foot  and ankle at end of the case.  He was taken from the operating room to  the PACU in stable condition.  Sponge and needle count were correct.   Help with surgical technique and decision making, Mr. Jaquelyn Bitter. Chabon,  P.A.C.'s assistance was needed throughout the entire case.           ______________________________  Erasmo Leventhal, M.D.     RAC/MEDQ  D:  10/19/2007  T:  10/19/2007  Job:  [id number]
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
## 2010-07-07 NOTE — Procedures (Signed)
DUPLEX DEEP VENOUS EXAM - LOWER EXTREMITY   INDICATION:  Follow up right lower extremity DVT.   HISTORY:  Edema:  Right.  Trauma/Surgery:  TKA on the right, 10/19/07.  Pain:  Right lower extremity.  PE:  No.  Previous DVT:  Right lower extremity.  Anticoagulants:  Coumadin.  Other:   DUPLEX EXAM:                CFV   SFV   PopV  PTV    GSV                R  L  R  L  R  L  R   L  R  L  Thrombosis    +     +     0     +      0  Spontaneous   +     +     +     +      +  Phasic        +     +     +     +      +  Augmentation  +     +     +     +      +  Compressible  P     P     +     P      +  Competent   Legend:  + - yes  o - no  p - partial  D - decreased   IMPRESSION:  Evidence of acute deep venous thrombosis in right common  femoral vein, superficial femoral vein, and posterior tibial veins.  All  other imaged veins appear patent.    _____________________________  Larina Earthly, M.D.   AS/MEDQ  D:  11/10/2007  T:  11/10/2007  Job:  [id number]

## 2010-07-07 NOTE — Discharge Summary (Signed)
NAMEJARONE, OSTERGAARD NO.:  [id number]   MEDICAL RECORD NO.:  [id number]          PATIENT TYPE:  INP   LOCATION:  1618                         FACILITY:  [facility]   PHYSICIAN:  Erasmo Leventhal, M.D.DATE OF BIRTH:  1934-12-20   DATE OF ADMISSION:  10/19/2007  DATE OF DISCHARGE:  10/23/2007                               DISCHARGE SUMMARY   ADMISSION DIAGNOSIS:  End-stage osteoarthritis right knee.   DISCHARGE DIAGNOSIS:  End-stage osteoarthritis right knee.   OPERATION/PROCEDURE:  Arthroplasty, right knee.   BRIEF HISTORY:  This is 76 year old gentleman with history of the left  knee who did well.  He now has end-stage osteoarthritis of the right  knee with failure of conservative treatment to alleviate his pain and  discomfort.  After discussion of treatment benefits and risks, the  patient now scheduled for total knee arthroplasty.  He has had medical  clearance by his medical doctor.   LABORATORY VALUES:  Admission CBC within normal limits with the  exception of high RDW at 15.9.  Admission urinalysis within normal  limits.  Admission PT/PTT within normal limits.  Admission CMET within  normal with the exception of elevated glucose at 180.  Hemoglobin/hematocrit reached a low of 12.7 and 37.2 by discharge.  BMET  remained within normal limits with the exception of mildly elevated  glucose throughout admission.   COURSE IN THE HOSPITAL:  The patient tolerated the procedure well.  First postoperative day vital signs stable.  Hemoglobin/hematocrit still  within normal limits.  BMET showed a slightly elevated glucose of 160.  Lungs clear.  Heart sounds normal.  Bowel sounds active.  Calves  negative.  Dressing was dry.  Drain was removed without difficulty.  Second postoperative day vital signs were stable.  He was afebrile.  Hemoglobin/hematocrit remained stable.  Neurovascular is intact.  Dressing was changed.  Wound was benign.  Calves were negative.   Third  postoperative day vital signs remained stable, no complaints, good  appetite, lungs clear.  Heart sounds normal.  Bowel sounds active.  Calves were negative.  Wound was clean and dry without evidence of  infection and plans were made for discharge home on Monday and on Monday  he was feeling good with vital signs stable.  Temperature 99.  I&O good.  Lungs clear.  Heart sounds normal.  Bowel sounds active.  Calves  negative.  Wound benign.  The patient is subsequently discharged home  for followup in the office.   CONDITION ON DISCHARGE:  Improved.   DISCHARGE MEDICATIONS:  1. Oxycodone 5 mg 1-2 pills every six hours p.r.n. pain.  2. Robaxin 5 mg one p.o. q.a.c. p.r.n. spasm.  3. Lovenox 30 mg one shot each day at 7 a.m. and 7 p.m.  4. Stool softener for two weeks.  5. When his Lovenox runs out, if it is okay with his medical doctor      (we will check this out), 81 mg aspirin a day for 2 weeks.   DISCHARGE INSTRUCTIONS:  To keep the wound clean and dry.  Use his  crutches and CPM at home.  Continue with range of motion and call the  office for followup in 2 weeks or call sooner p.r.n. problems.   DISCHARGE DIET:  Low sodium, heart-healthy.      Jaquelyn Bitter. Chabon, P.A.    ______________________________  Erasmo Leventhal, M.D.    SJC/MEDQ  D:  10/23/2007  T:  10/23/2007  Job:  [id number]

## 2010-07-07 NOTE — H&P (Signed)
NAME:  [name], [name] NO.:  [id number]   MEDICAL RECORD NO.:  [id number]          PATIENT TYPE:  INP   LOCATION:  NA                           FACILITY:  [facility]   PHYSICIAN:  Erasmo Leventhal, M.D.DATE OF BIRTH:  Jul 09, 1934   DATE OF ADMISSION:  DATE OF DISCHARGE:                              HISTORY & PHYSICAL   DATE OF SURGERY:  October 19, 2007.   ADMISSION DIAGNOSIS:  End-stage osteoarthritis right knee.   HISTORY OF PRESENT ILLNESS:  This 76 year old gentleman with a history  of total knee arthroplasty on the left knee 2 years ago with good result  who has end-stage osteoarthritis of the right knee that has failed to  respond to conservative treatment.  The patient has had medical  clearance from his medical doctor is Dr. Madilyn Fireman and Dr. Nicholos Johns and has had  clearance for Lovenox use postoperatively for DVT prophylaxis by Dr.  Madilyn Fireman, his gastroenterologist.  His surgery will go ahead as scheduled.  We will have Dr. Benjaman Pott group follow the patient in the hospital for  medical management.   DRUG ALLERGIES:  None.   CURRENT MEDICATIONS:  1. Basalazide 750 mg one 3 times a day.  2. Flomax 0.4 mg 1 twice a day.  3. Amlodipine 10 mg 1 once a day.  4. __________ 2.5 mg one daily.  5. Avodart 0.5 mg one daily.  6. Allopurinol 300 mg one daily.  7. Prilosec 20 mg one daily.  8. Alertec 10 mg one daily.   PAST SURGICAL HISTORY:  1. Left total knee arthroplasty.  2. Right shoulder foreign body removal.  3. Right partial pneumonectomy.  4. Hemorrhoidectomy x4.  5. Right nephrectomy for renal cell carcinoma.  6. Partial colectomy for bowel rupture.  7. Herniorrhaphy x2.   SERIOUS MEDICAL ILLNESS:  1. Renal cell carcinoma.  2. GI blood loss of unknown origin.  3. Hypertension.  4. Coronary artery disease.   SOCIAL HISTORY:  Patient is married.  He used to smoke and use to drink,  but does not do either now.  He lives at home with his wife.   FAMILY  HISTORY:  Positive for cancer and CVA.   REVIEW OF SYSTEMS:  CENTRAL NERVOUS SYSTEM:  Negative for headache,  blurred vision or dizziness.  PULMONARY:  Negative for shortness of  breath, PND and orthopnea.  Positive for partial right lower lobe  pneumonectomy.  CARDIOVASCULAR:  Negative for chest pain and  palpitation.  GI: Positive for right renal nephrectomy secondary to  renal carcinoma.  GI: Positive for history of GI bleed of unknown  origin.  MUSCULOSKELETAL:  Positive in HPI.   PHYSICAL EXAMINATION:  VITAL SIGNS:  BP 138/88, respirations 16, pulse  78 and regular.  GENERAL APPEARANCE:  This is a well-developed, well-nourished gentleman  in no acute distress.  HEENT:  Head normocephalic.  Nose patent.  Ears patent.  Pupils equal,  round and reactive light.  Throat without injection.  NECK:  Supple without adenopathy.  Carotids 2+ without bruit.  CHEST:  Clear to auscultation.  No rales or rhonchi.  Respirations  16.  HEART:  Regular rate and rhythm at 78 beats per minute without murmur.  ABDOMEN:  Well-healed scars.  There is a large abdominal hernia.  Bowel  sounds are active.  No organomegaly noted.  NEUROLOGIC:  Patient alert and oriented to time, place and person.  Cranial nerves II-XII grossly intact.  EXTREMITIES:  Left knee status post total knee arthroplasty with full  extension, further flexion to 126 degrees.  Right knee with a 5 degrees  flexion contracture, further flexion 125 degrees.  Dorsalis pedis,  posterior tibialis pulses 2+.   X-rays show end-stage osteoarthritis right knee.   IMPRESSION:  End-stage osteoarthritis right knee.   PLAN:  Total knee arthroplasty right knee.      Jaquelyn Bitter. Chabon, P.A.    ______________________________  Erasmo Leventhal, M.D.    SJC/MEDQ  D:  10/02/2007  T:  10/02/2007  Job:  [id number]

## 2010-07-07 NOTE — Discharge Summary (Signed)
NAMETREK, KIMBALL NO.:  [id number]   MEDICAL RECORD NO.:  [id number]          PATIENT TYPE:  INP   LOCATION:  1503                         FACILITY:  [facility]   PHYSICIAN:  Ramiro Harvest, MD    DATE OF BIRTH:  12/05/1934   DATE OF ADMISSION:  10/27/2007  DATE OF DISCHARGE:  11/01/2007                               DISCHARGE SUMMARY   PRIMARY CARE PHYSICIAN:  Dr. Nicholos Johns of [facility].   DISCHARGE DIAGNOSES:  1. Gallstone pancreatitis status post endoscopic retrograde      cholangiopancreatography.  2. Right lower extremity deep venous thrombosis.  3. Acute renal failure.  4. Hypokalemia.  5. Status post right total knee arthroplasty on October 19, 2007.  6. Dehydration.  7. Hypertension.  8. Reactive leukocytosis.  9. Renal cell carcinoma status post right nephrectomy.  10.Status post partial colectomy.  11.Gout.  12.Benign prostatic hypertrophy.  13.History of diverticulitis.  14.Nonspecific colitis in the past, has been managed with mesalamine-      type products which the patient uses in dose proportion into      symptoms.  Note that at the time of his colonoscopy in September      2007, no colitis was endoscopically evident.  15.Hyperlipidemia.  16.Problem with obscure gastrointestinal bleeding, September 2007 for      which he underwent multiple transfusions and a definite source was      never identified despite endoscopy, video capsule endoscopy and      angiography.  At that time, he did have two small clean based      duodenal ulcers and diverticulosis and a bleeding scan appeared to      localize to the small bowel.   DISCHARGE MEDICATIONS:  1. Lovenox 100 mg subcu q.12 h.  2. Coumadin 7.5 mg p.o. q.h.s.  3. Basalazide capsules 750 mg 3 capsules three times daily.  4. Flomax 0.4 mg 2 tablets p.o. daily.  5. Avodart 0.5 mg p.o. daily.  6. indapamide 2.5 mg p.o. q.a.m.  7. Norvasc 10 mg p.o. daily.  8. Methocarbamol 500 mg p.o. q.8 h  p.r.n.  9. Oxycodone 5 mg 1-2 tablets p.o. q.6 h p.r.n.  10.Allopurinol 300 mg p.o. daily.  11.Colace 100 mg p.o. daily.  12.Omeprazole 20 mg p.o. daily.   DISPOSITION/FOLLOWUP:  1. The patient will be discharged home.  The patient will need daily      PTs and INRs per home health until INR is at goal of 2-3 and then      discontinue Lovenox.  These results need to be a stent to the      patient's PCP, Dr. Nicholos Johns.  2. Patient needs to schedule an appointment to follow up with his PCP      in one week.  On follow-up, a basic metabolic profile needs to be      checked to follow up on the patient's renal function and      electrolytes and also to follow up on the patient's INR.  3. The patient is to follow up with Dr. Thomasena Edis in 10 days.  4.  The patient is also to follow up with Dr. Colin Benton in one month for an      elective cholecystectomy.  5. The patient is to call for an appointment to follow up with either      Dr. Matthias Hughs on Dr. Evette Cristal of [facility].  6. The patient will need a hepatic panel checked to follow up on his      elevated liver function tests.   CONSULTATIONS:  A gastroenterology consult was done.  The patient was  seen in consultation by Dr. Matthias Hughs of [facility] on  October 28, 2007.   PROCEDURES PERFORMED:  1. Chest x-ray was performed on October 27, 2007, which showed small      left pleural effusion with adjacent airspace disease, most likely      atelectasis.  Early infection cannot be excluded.  2. A right upper quadrant abdominal ultrasound was done on October 28, 2007, that showed intra and extrahepatic biliary ductal      dilatation without visualization of the common duct of pancreatic      given a history of elevated lipase.  Findings are most likely      related to an obstructive distal common duct stone.  Other      obstructive process cannot be excluded.  Consider further      evaluation with dedicated abdominal CT or MRCP.   Status post right      nephrectomy with mild left lower pole pelviectasis.  This may be      physiologic.  Numerous hyperechoic foci within the left kidney, the      largest demonstrates shadowing.  These could represent foci of      calcification or calcified lesions prior CT demonstrated foci of      increased density which were likely related to scarring or      hemorrhagic cysts.  Consider nonemergent outpatient cross-sectional      imaging either pre and postcontrast CT or MRI for evaluation of the      left kidney.  3. ERCP sphincterectomy was done on October 29, 2007, by Dr. Madilyn Fireman of      [facility] which showed suggestion on initial      cholangiograms of vaguely delineated sludge which only minimal      amounts were seen to be delivered with the balloon.  A large      sphincterectomy was performed, otherwise appeared clear, but      dilated.  4. Venous Dopplers were done on October 31, 2007, which showed a      right nonocclusive DVT noted in the common femoral vein and femoral      vein to the mid thigh, from the mid thigh through the popliteal      vein appears patent.  DVT noted in the posterior tibial and      perennial veins in the mid calf.   BRIEF ADMISSION HISTORY AND PHYSICAL:  [name] is a 76-year-  old white gentleman who had had right knee replacement October 19, 2007,  and went home the past Monday prior to admission with a noted right calf  pain.  The patient saw Dr. Thomasena Edis, his orthopedic surgeon.  For this,  had an ultrasound Dopplers which were done which were negative.  The  patient had blood work also for that showed incidental elevated liver  function tests and elevated white count.  The patient was then evaluated  by his PCP  and ultrasound of the abdomen showed gallstones.  He was  Admitted as he also had a dilated possibly intra and extrahepatic duct.  The patient had over the last few days been complaining of abdominal  pain, low  grade fevers and chills and had some nausea and some  intermittent confusion.   PHYSICAL EXAMINATION:  VITAL SIGNS:  Per admitting physician,  temperature 98.0, pulse of 101, respiratory rate 20, blood pressure  120/72, sating 98% on room air.  GENERAL:  Patient is an elderly male who looked his stated age, lying  flat in bed in no acute distress.  HEENT:  Normocephalic, atraumatic.  Pupils equal, round and reactive to  light and accommodation.  Extraocular movements intact.  Oropharynx was  dry.  NECK:  Supple.  No lymphadenopathy.  No thyromegaly.  No JVD.  LUNGS:  Clear to auscultation bilaterally.  ABDOMEN:  Had some mild tenderness to deep palpation.  Positive bowel  sounds, non distended and soft.  EXTREMITIES:  No cyanosis or clubbing.  Right lower extremity edema.   ADMISSION LABORATORY DATA:  Sodium 137, potassium 3.4, chloride 96,  bicarb 29, BUN 13, creatinine 1.6, lipase was elevated up to 81, amylase  elevated a 336, INR of 1.2, PT of 39, total bilirubin of 4.6, alk  phosphatase was 221, AST of 178, ALT of 160, albumin of 3.2, total  protein of 7.2, white count of 12.1, hemoglobin 14.8, platelets of 886  with segments of 83%.   HOSPITAL COURSE:  1. Gallstone pancreatitis.  The patient was admitted as a direct admit      for gallstone pancreatitis.  The patient was placed on a bowel      rest, placed on IV fluids and pain management.  He was started on      IV Zosyn secondary to low grade fevers.  Blood cultures were      obtained which were sent and came back negative.  The patient's      lipase and amylase levels were checked on a daily basis as well as      his liver function tests which were initially elevated, but trended      down during the whole hospitalization.  The patient's symptoms      improved during the hospitalization.  A repeat right upper quadrant      ultrasound was done with results as stated above which were      consistent with an obstruction  distal to the common bile duct      stone.  GI was consulted.  The patient was seen in consultation by      Dr. Matthias Hughs.  An ERCP with sphincterectomy was done per Dr. Madilyn Fireman      on October 29, 2007, and the patient was monitored during that      time.  The patient was managed symptomatically.  The patient on      presentation did have a some jaundice which improved during the      hospitalization by day of discharge the patient was asymptomatic      and the patient was in stable and improved condition.  The      patient's case was discussed with Dr. Colin Benton of [facility], and it was felt that the patient could follow up as an      outpatient in a month for possible elective cholecystectomy.  The      patient was discharged in stable and  improved condition to follow      up with Dr. Colin Benton of [facility] as stated above.  2. Right lower extremity DVT during the hospitalization.  The      patient's anticoagulation had to be held as he was on      anticoagulation for recent knee surgery, had to be held for the      procedure of an ERCP.  This was discussed with orthopedics, it was      felt that it will be okay to hold the patient's Lovenox until ERCP      was obtained ERCP was done.  Dopplers of the patient's right lower      extremity were obtained after ERCP procedure was done, and it was      noted that the patient had a deep venous thrombosis.  The patient      was already on Lovenox.  Coumadin was started per pharmacy during      the hospitalization.  The patient was anxious to go home and as      such was discharged home on Lovenox bridge with Coumadin.  The      patient will need home health to check PT and INRs on a daily basis      until his INR is at goal of 2-3 for 48 hours prior to discontinuing      his Lovenox.  These results will need to be called into the      patient's PCP, Dr. Nicholos Johns.  3. Acute renal failure.  The patient was noted to have an  acute renal      failure on admission.  This was felt to be prerenal in nature      secondary to dehydration as the patient was hydrated with IV      fluids.  The patient's acute renal failure improved on a daily      basis and was resolved by day of discharge.  4. Hypokalemia.  The patient was noted to be hypokalemic on admission.      The patient's potassium was repleted and the patient was discharged      in stable condition.  5. Dehydration.  The patient was noted to be dehydrated during the      hospitalization.  The patient was hydrated with IV fluids and by      day of discharge the patient was euvolemic.  6. Reactive leukocytosis.  On admission, the patient was noted to have      a leukocytosis.  It was initially started on Zosyn.  The patient      was pan cultured, but cultures came back negative.  The patient's      white count improved and was within normal limits.  By day of      discharge, it was felt the patient's leukocytosis was likely a      reactive leukocytosis, and the patient was discharged in stable and      improved condition.  7. Hypertension.  During the hospitalization, the patient's blood      pressure medications were held secondary to his dehydration and      were restarted on discharge.  The rest of the patient's chronic      medical issues were stable throughout the hospitalization and the      patient was discharged in stable and improved condition.   DISCHARGE VITAL SIGNS:  On day of discharge, vital signs temperature  97.0, pulse of 66, respirations  16, blood pressure 148/91, sating 95% on  room air.   DISCHARGE LABORATORIES:  Discharge labs on day of discharge, sodium of  May 23, 1935, potassium 4.5, chloride 105, bicarb 29, glucose 112, BUN  13, creatinine 1.19 and a calcium of 9.4, PT of 14.1, INR of 1.1.  CBC  with a white count of 7.7, hemoglobin of 12.4, hematocrit of 37.1,  platelets of 364, ANC of 5.1, lipase of 117, amylase of 110,  bilirubin  of 6.7, alk phosphatase of 357, AST of 194, ALT of 185 and a calcium of  8.9.  The patient will be discharged in stable and improved condition to  follow up with his PCP as stated above.   It was a pleasure taking care of Mr. [name].      Ramiro Harvest, MD  Electronically Signed     DT/MEDQ  D:  01/10/2008  T:  01/10/2008  Job:  [id number]   cc:   Molly Maduro A. Nicholos Johns, M.D.  Fax: [phone]   Alfonse Ras, MD  [address], [address]  [place]  [place] [address]   Bernette Redbird, M.D.  Fax: [phone]   Graylin Shiver, M.D.  Fax: [phone]   Erasmo Leventhal, M.D.  Fax: [phone]

## 2010-07-07 NOTE — Op Note (Signed)
[name], MENDELL               ACCOUNT NO.:  [id number]   MEDICAL RECORD NO.:  [id number]          PATIENT TYPE:  INP   LOCATION:  1503                         [facility]ACILITY[facility]  [facility]   PHYSICIAN:  John C. Madilyn Fireman, M.D.    DATE OF BIRTH:  [birth year]   DATE OF PROCEDURE:  10/29/2007  DATE OF DISCHARGE:                               OPERATIVE REPORT   PROCEDURE:  Endoscopic retrograde cholangiopancreatography with  sphincterotomy.   INDICATIONS FOR PROCEDURE:  Jaundice, dilated common bile duct, multiple  gallstones all suggestive of retained common bile duct stone with also  mild evidence of gallstone pancreatitis.   PROCEDURE IN DETAIL:  The patient was placed in the left lateral  decubitus position and placed on the pulse monitor with continuous low-  flow oxygen delivered by nasal cannula.  He was sedated with 100 mcg IV  fentanyl and 10 mg IV Versed.  The Olympus side-viewing endoscope was  advanced blindly into the oropharynx, esophagus and stomach.  The  pylorus was traversed and the papilla of Vater located on the medial  duodenal wall.  It had a normal appearance and was cannulated  selectively with a Wilson-Cook sphincterotome.  Cholangiogram was  obtained which showed a diffusely dilated common bile duct and common  hepatic duct.  There appeared to be some sludge like vague defects on  initial cholangiogram.  A large sphincterotomy was performed and  adjustable 18 mm balloon catheter advanced high into the hepatic duct.  It was dragged down and only a minimal amount of sludgy debris was seen  to be delivered.  Multiple more balloon sweeps and occlusion  cholangiograms were made and no further filling defects were seen.  There was good drainage at the end of the procedure.  There was no  evidence of ampullary tumor or any irregularity of the duct to  specifically suggest neoplasm.  The scope was then withdrawn and the  patient returned to the recovery room in stable condition.   He tolerated  the procedure well.  There were no immediate complications.   IMPRESSION:  Suggestion on initial cholangiograms of vaguely delineated  sludge which only minimal amounts were seen to be delivered with the  balloon after large sphincterotomy was performed.  Otherwise, the duct  appeared clear but dilated.   PLAN:  Observe response to today's procedure in terms of normalization  of liver function tests, amylase and lipase.  If elevated liver function  tests do not improve consider CT scan to look for any subtle neoplasm.           ______________________________  Everardo All Madilyn Fireman, M.D.     JCH/MEDQ  D:  10/29/2007  T:  10/29/2007  Job:  [id number]   cc:   Erasmo Leventhal, M.D.  Fax: [phone]

## 2010-07-07 NOTE — Consult Note (Signed)
NAMEGRAIG, HESSLING NO.:  [id number]   MEDICAL RECORD NO.:  [id number]          PATIENT TYPE:  INP   LOCATION:  1503                         FACILITY:  [facility]   PHYSICIAN:  Bernette Redbird, M.D.   DATE OF BIRTH:  11/24/1934   DATE OF CONSULTATION:  10/28/2007  DATE OF DISCHARGE:                                 CONSULTATION   GASTROENTEROLOGY CONSULTATION   Dr. Ramiro Harvest, of the [facility] hospitalists, asked [place] to see this  76 year old gentleman because of possible gallstone pancreatitis.   Mr. Mina Marble has a longstanding history of gallstones, but apparently  they had never been a problem until recently.   The patient is 9 days status post a right total knee replacement.  Apparently, he developed some discomfort in the right leg following that  procedure, prompting Dopplers to rule out a DVT (reportedly negative)  and screening blood work, which included liver chemistries which were  unexpectedly found to be elevated, where as they had been completely  normal preoperatively.  This prompted referral to his primary physician  and an abdominal ultrasound was performed which was done at an outside  facility so we do not have the report.  Allegedly, it showed gallstones  and a dilated bile duct.  The patient had elevation not only of his  liver chemistries but also of amylase and lipase so he was directly  admitted to the hospital last night.  Yesterday, he had a little  twinge of upper abdominal pain, but there has been no significant  abdominal pain, no fevers or shaking chills, no food intolerance, no  nausea or vomiting, in other words, nothing to really clinically suggest  acute pancreatitis.   Since being admitted to the hospital, the patient's white count has  risen from 12,000 to almost 15,000, and his liver chemistries have crept  up with alk phos going from 212 to 241, AST going from 178 to 318, ALT  going from 116 to 258 and his bilirubin  climbing from 4.6 to 10.7.  The  patient has also noticed some dark urine.  His amylase was 336 on  admission and overnight it dropped to 236, whereas his lipase rose  slightly from 281 to 307.   PAST MEDICAL HISTORY:  ALLERGY TO VICODIN.   OUTPATIENT MEDICATIONS:  1. Balsalazide 3-9 capsules daily according to the colitis symptoms.  2. Flomax 2 capsules daily.  3. Indapamide 2.5 mg each morning.  4. Avodart 0.5 mg each morning.  5. Amlodipine 10 mg daily.  6. Methocarbamol 500 mg q.8 h p.r.n.  7. Oxycodone 5 mg to 10 mg q.6 h p.r.n.  8. Allopurinol 300 mg daily.  9. Docusate sodium 100 mg daily.  10.Omeprazole 20 mg daily.  11.Lovenox 30 mg subcutaneously b.i.d., the latter initiated following      his hospital discharge from the recent knee surgery.   OPERATIONS:  In 2003, the patient had perforated diverticulitis  necessitating a sigmoid colectomy and temporary colostomy; he  subsequently had colostomy takedown.  He has had several ventral hernia  repairs as well.  He has also had  a right nephrectomy for renal cell  carcinoma.  That was many years ago.   MEDICAL ILLNESSES:  1. Hyperlipidemia.  2. Gout.  3. History of kidney cancer.  4. History of diverticulitis.  5. A problem with obscure GI bleeding in September 2007, for which he      underwent multiple transfusions and a definitive source was never      identified despite endoscopy, video capsule endoscopy and      angiography.  At that time, he did have 2 small clean-based      duodenal ulcers and diverticulosis and a bleeding scan appeared to      localize to the small bowel.  6. As far as I am aware, the patient has no history of coronary      disease or COPD.  7. It is also noteworthy that he has had surgery for removal of      foreign bodies in the right shoulder, a right partial      pneumonectomy, a left total knee replacement, hemorrhoidectomy on      several occasions and bilateral herniorrhaphies and there  is      history of hypertension and apparently some coronary disease, but      again, to my knowledge, no history of MI.  8. There is also a history of severe colitis in the past, the exact      type is unknown, but it has been managed with mesalamine-type      products which the patient uses in a dose proportionate to      symptoms.  Note that at the time of his colonoscopy in September      2007, no colitis was endoscopically evident.   FAMILY HISTORY:  Negative for GI illnesses, as far as I am aware.   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  He is married and his wife is at  the bedside.   REVIEW OF SYSTEMS:  Negative for prodromal anorexia, weight loss or  ongoing GI tract symptomatology of any significance.  He has  occasionally seen some dark stools.   PHYSICAL EXAM:  A well-preserved Caucasian male who does not appear  overtly jaundiced but has a somewhat sallow complexion.  He is without  overt pallor.  VITAL SIGNS:  Currently include temperature 98.0 (he has been afebrile  since admission), systolic blood pressure 122, diastolic blood pressure  76, pulse 79, respirations 18 and unlabored, O2 sat essentially normal  at 94% on room air.  CHEST:  Clear to auscultation.  HEART:  Heart sounds are distant and basically inaudible.  ABDOMEN:  Remarkably nontender and without organomegaly.  There is a  gumball size subcutaneous mass in the epigastric area, slightly tender.  The patient was not aware of it previously.  No overt organomegaly, no  peritoneal findings.  I think that bowel sounds were present and normal.  RECTAL:  Shows no masses and brown Hemoccult negative stool (sent to lab  for analysis).   Ultrasound:  See HPI above.  We are repeating the ultrasound at this  hospital for an updated, more detailed evaluation.   LABS:  Pertinent  labs are per HPI.  It is noteworthy that on August  24th, prior to the patient's surgery, liver chemistries were entirely  within normal limits with bilirubin 1.0, alk phos 66, AST 26,  ALT 23,  whereas now they are substantially elevated.  Renal function was normal  preoperatively with BUN 19 and creatinine 1.1; on this admission it has  risen slightly to BUN 33, creatinine 1.5.   IMPRESSION:  1. New-onset biliary process, most likely common duct stone with      smoldering cholangitis and/or mild gallstone pancreatitis, but      without severe symptomatology, physical findings or toxicity.  2. Cholelithiasis, apparently of long standing.  3. Nine days status post total knee replacement with need for some      sort of deep vein thrombosis prophylaxis/anticoagulation, for which      he has already received about 7 days of Lovenox.  4. History of GI bleed of obscure origin in September 2007.  5. History of sigmoid resection for perforated diverticulitis in 2003      with temporary colostomy and subsequent ventral hernias.  6. History of nonspecific colitis, managed by Dr. Madilyn Fireman with      balsalazide.  7. Remote history of renal cell cancer.  8. Unable to have MRI scan due to multiple surgical clips and shrapnel      in his right shoulder sustained from an injury in [place].   DISCUSSION:  Mr. Mina Marble presents with a very interesting condition.  He appears to have gallstone pancreatitis or cholangitis by radiographic  and biochemical parameters, but not by symptoms or physical findings.  I  am quite sure that this is due to stone disease rather than neoplasia  since his liver chemistries just 2 weeks ago were normal, so we seem to  be dealing with a subacute rather than an acute or chronic process.   RECOMMENDATIONS:  1. Repeat ultrasound to try to clarify the situation a little bit      more.  2. Monitor labs.  3. For now, I think continued antibiotic therapy is prudent even       though the patient is afebrile, taking into account his elevated      white count.  4. I would favor ERCP tomorrow, the nature, purpose and risks of which      were reviewed with the patient and his wife in great detail with      the assistance of a diagram.  They understand this would probably      be done by my covering partner, Dr. Dorena Cookey, who knows the      patient already anyway.  MRCP is not feasible given the fact the      patient has shrapnel in his body and thus cannot have MRI scans,      and endoscopic ultrasound is not readily available on short notice      on the holiday weekend.  I contemplated a CT scan but it would be      relatively unlikely to show the things the patient is most likely      to have, so I think proceeding directly to an ERCP is the best      course of action.  5. One issue we have to contend with his what to do about the      patient's anticoagulation and DVT prophylaxis.  In this regard, I      have spoken with Dr. Thomasena Edis' covering partner, Dr. Jene Every,      who feels that it is okay to keep the patient off Lovenox until      ERCP tomorrow (he has already been off it today)  and then decide on      the type of anticoagulation following the procedure.  Ideally, the      patient would get additional Lovenox following the procedure, for      an interval somewhere between a few days and a couple of weeks.      Thus, if the ERCP can be done without need for sphincterotomy,      perhaps by placing a biliary stent, that would be ideal and would      permit appropriate anticoagulation to continue.  If a common duct      stone is present, the stent would probably prevent complications      such as obstruction and pancreatitis for the time being until such      time as the patient no longer needs anticoagulation, at which time      the patient could then undergo a repeat ERCP with more definitive      intervention such as sphincterotomy and stone  extraction.  However,      with the rising liver      chemistries and white count, I do not think we have the luxury of      holding off on ERCP indefinitely, even in the absence of any      significant symptoms or clinical toxicity.   We appreciate the opportunity to have seen this very pleasant patient in  consultation with you.           ______________________________  Bernette Redbird, M.D.     RB/MEDQ  D:  10/28/2007  T:  10/29/2007  Job:  [id number]   cc:   Molly Maduro A. Nicholos Johns, M.D.  Fax: [phone]   Erasmo Leventhal, M.D.  Fax: [phone]   Everardo All. Madilyn Fireman, M.D.  Fax: [phone]

## 2010-07-07 NOTE — Consult Note (Signed)
NEW PATIENT CONSULTATION   Riggs, Robert C  DOB:  02-27-1934                                       11/10/2007  CHART#:[id number]   Patient presents today for evaluation regarding deep venous thrombosis  following right total knee replacement on 10/19/07.  He has had multiple  venous duplexes since the procedure with somewhat confusing findings.  He has had suggestion of mobile thrombus at the saphenofemoral junction  with his most recent study at [facility] on 11/09/07.  He does  not have any history of deep venous thrombosis.  He is on Coumadin  treatment since his initial DVT diagnosis by duplex.  He does have some  continued swelling in his right leg and also has some fluid around the  right knee, status post surgery three weeks ago.   On reviewing his duplex, initial duplex on 09/08 suggested nonocclusive  DVT in the common femoral vein from mid thigh to popliteal vein.  Also  some thrombus in the posterior tibial and peroneal veins.  Subsequent  study on September 15th showed no DVT bilaterally.  Finally, on 09/17,  an additional study showed acute nonocclusive mobile thrombus at the  saphenofemoral junction, acute nonocclusive thrombus at the popliteal  vein, and thrombus in the mid calf, communicating veins in the posterior  tibial vein.   PHYSICAL EXAMINATION:  Patient is a well-developed and well-nourished  white male appearing his stated age of 9.  Blood pressure is 119/83, pulse 93, respirations 18.  His right knee is status post surgery on the anterior surface of his  patella and knee.  He has mild swelling in his right versus left leg.   He underwent repeat duplex today with no charge from our lab, and I have  reviewed this with our technologist.  This does show thrombus in the  common femoral vein.  Do not see any evidence of mobile thrombus.  This  does not appear to have any popliteal venous thrombus today and does  have some tibial  venous thrombus.  This is consistent with his initial  first study in early September.  He does have a decreased amount of  echo, and this does not appear to be totally occlusive; therefore, I  feel that the somewhat confusing studies did not show coming and going  of thrombus.  Do not see any evidence of mobile thrombus and do not see  any indication for vena cava filter.   I discussed this at length with patient and his wife present.  I have  recommended that he continue his full activities, limited only by his  recent knee surgery.  They are asking regarding icing of this, and I  feel that this does not cause any increased risk for venous problems.  He will continue his followup with Dr. Thomasena Edis and see [place] again on an as-  needed basis.   I explained that would recommend approximately six months of Coumadin  therapy prior to discontinuation for his deep venous thrombosis.   Larina Earthly, M.D.  Electronically Signed   TFE/MEDQ  D:  11/10/2007  T:  11/13/2007  Job:  [id number]   cc:   Erasmo Leventhal, M.D.  Robert A. Nicholos Johns, M.D.

## 2010-07-10 NOTE — Discharge Summary (Signed)
[name], JANOWIAK NO.:  [id number]   MEDICAL RECORD NO.:  [id number]          PATIENT TYPE:  INP   LOCATION:  2908                         FACILITY:  [facility]   PHYSICIAN:  Deirdre Peer. Polite, M.D. DATE OF BIRTH:  [birth year]   DATE OF ADMISSION:  11/06/2005  DATE OF DISCHARGE:  11/12/2005                                 DISCHARGE SUMMARY   DISCHARGE DIAGNOSES:  1. Gastrointestinal bleed, probable small bowel source.  Patient is status      post EGD x2 without definitive source.  EGD showed 2 ulcers in the      duodenal bulb without any signs of bleeding.  Patient underwent      colonoscopy.  Only showed diverticula.  Had the pooled red blood cell      scan that did localize into the small bowel.  The patient also had a      mesenteric arteriogram without any signs of bleeding.  The patient had      a capsule endoscopy without definitive sign of bleeding.  Currently,      patient's hemoglobin is stable at 8.8.  He is ambulating without any      difficulty at all.  Has been cleared for discharge by GI and surgery.  2. Syncope secondary to gastrointestinal bleed with resulting orthostasis.  3. Dyslipidemia.  4. Benign prostatic hypertrophy.  5. Gout.  6. Renal cell carcinoma.  7. History of ruptured bowel with colostomy.  8. History of right nephrectomy.   DISCHARGE MEDICATION:  1. Protonix 40 mg b.i.d.  2. Colazal 750 mg 3 times a day.  3. Tricor daily.  4. Flomax daily.  5. Allegra 180 mg daily.  6. Allopurinol 300 mg daily.  7. Avodart 0.5 mg daily.   DISPOSITION:  Being discharged to home in stable condition, clear for GI and  surgery.   CONSULTANTS:  [facility], Dr. Evette Cristal; [facility], Dr. Zachery Dakins.   STUDIES:  Patient underwent EGD x2.  Showed 2 duodenal ulcers.  No evidence  of visible stigmata of bleeding.  Patient had a colonoscopy which showed  diverticulosis, no active bleeding.  Patient had a tagged red blood cell  scan that suggested a  mid small bowel bleed.  Patient had a mesenteric  arteriogram.  Showed no evidence of active bleeding or other arterial  abnormality.   HISTORY OF PRESENT ILLNESS:  A 76 year old male presented to the ED with  blood per rectum and also had a syncopal spell.  Admission was deemed  necessary for further evaluation and treatment.  Please see dictated H&P for  further details.   PAST MEDICAL HISTORY:  As stated above in admission H&P.   MEDICATIONS:  As stated above.   FAMILY HISTORY:  Per admission H&P.   HOSPITAL COURSE:  The patient was admitted to an ICU for further evaluation  and treatment of syncope secondary to a GI bleed.  The patient was seen in  consultation by GI, surgery.  Had several studies, as stated above:  EGD x2,  colonoscopy, pooled red blood cell scan, mesenteric arteriogram.  Results as  stated above.  EGD just showed shallow ulcers without definitive bleeding.  Colonoscopy showed diverticula, no active bleeding.  Pooled red blood cell  scan suggested a small bowel source.  Patient was seen by GI and surgery.  There was no surgical indication during this hospitalization.  However, the  patient did require multiple transfusions because of continued  unidentifiable bleeding in the hospital.  Over the course of the  hospitalization, the patient's hemoglobin did stabilize at 8.8.  When he was  completely asymptomatic after all of the above studies, it was felt that  patient's source was probably small bowel etiology and has resolved on its  own.  The patient's diet has been advanced, and he has had a normal bowel  movement.  The patient has been cleared for discharge by GI as well as  surgery.   At this time, patient is hemodynamically stable, ambulating without any  difficulty in the hall without chest pain, shortness of breath, or  orthostasis.  The case has been discussed with the patient's wife in detail.  She understands his hospitalization and course and all  studies that have  been done.  The patient has been advised to avoid NSAIDs and to continue his  PPI.  He needs to follow up with GI doctor, Dr. Madilyn Fireman, in about 2 weeks and  should follow up with primary MD for a followup CBC in 1 week.      Deirdre Peer. Polite, M.D.  Electronically Signed     RDP/MEDQ  D:  11/12/2005  T:  11/12/2005  Job:  [id number]   cc:   Dr. Lurena Joiner

## 2010-07-10 NOTE — Discharge Summary (Signed)
[name], [name] NO.:  [id number]   MEDICAL RECORD NO.:  [id number]          PATIENT TYPE:  INP   LOCATION:  1521                         FACILITY:  [facility]   PHYSICIAN:  Erasmo Leventhal, M.D.DATE OF BIRTH:  Dec 19, 1934   DATE OF ADMISSION:  07/28/2004  DATE OF DISCHARGE:  08/04/2004                                 DISCHARGE SUMMARY   ADMITTING DIAGNOSIS:  End-stage osteoarthritis, left knee.   DISCHARGE DIAGNOSIS:  End-stage osteoarthritis, left knee.   OPERATION:  Total knee arthroplasty, left knee.   BRIEF HISTORY AND PHYSICAL:  A 76 year old gentleman well known to [place] with a  history of end-stage osteoarthritis of his left knee.  He has failed  conservative management, and continues to have pain and difficulty with  daily living activities.  After discussion of treatments, benefits, risks  and options, the patient is now scheduled for total knee arthroplasty.  He  received medical clearance from his medical doctors [facility] [place].   LABORATORY VALUES:  The patient's CBC is within normal limits except for  slight eosinophilia of 13.0.  He ran a mildly elevated white count  postoperatively.  Hemoglobin and hematocrit remained normal postoperatively.  His PT/INR was therapeutic at discharge at 2.5.  Postoperatively, he had a  urinary tract infection with Pseudomonas UTI, and this was treated.   COURSE IN THE HOSPITAL:  On the first postoperative day, he had moderate  pain.  Vital signs were stable.  He was afebrile.  I&Os were good.  Hemovac  had 200 mL of drainage, and drain was removed without difficulty.  Calves  negative.  Dressing dry.  Heart sounds normal.  Lung sounds clear.  PT was  started.  On the second postoperative day, the patient was feeling better.  Vital signs stable.  Temperature to a max of 99.6.  O2 99% on 2 L.  Hemoglobin and hematocrit was stable.  Lungs were clear.  Bowel sounds  sluggish.  Heart sounds  normal.  Dressing was changed.  Wound was dry.  Calves were negative.  IV fluids switched to normal saline as he was a  little hyponatremic.  His Foley was DC, and Lovenox was DC as his INR was  therapeutic.  On the third postoperative day, he was feeling better on less  medication.  His vital signs were stable.  Mild tachycardia was noted.  Temperature was a max of 99.6.  O2 91% on 2 L.  WBC 13,200.  Hemoglobin and  hematocrit were normal.  Sodium was 132.  INR was 2.4.  No shortness of  breath.  The patient was alert and oriented to time, place and person.  There was some report from the night shift that he had some confusion, and I  discussed this with his wife and the nurses.  When I saw him, he had no  confusion at that time.  He did have decreased lung sounds at the bases.  Calves were negative.  Wound was benign.  He was put on a fluid restriction  for his hyponatremia.  Chest x-ray  was ordered due to his decreased breath  sounds, decreased O2 sat and increased white count.  Hospital consult was  requested due to his temperature.  Urinalysis was obtained.  On the fourth  postoperative day, he was doing well.  He has had no complaints.  Vital  signs stable.  Afebrile.  INR therapeutic.  Wound benign.  Calves negative,  and he continued with therapy, and was a candidate for rehab when available.  On the fifth postoperative day, he was resting comfortably.  Vital signs  were stable.  Temp to a max of 100.3.  Wound was negative, and calves were  negative.  On the sixth postoperative day, he was feeling good.  He stated  he wanted to go home.  His vital signs were stable.  He was afebrile.  INR  was therapeutic at 2.6.  Dressing was changed.  Wound was benign.  Calves  negative.  Lungs clear.  Bowel sounds active.  His urinalysis did show a  Pseudomonas UTI.  He was treated on that by the hospitalist, and will follow  up on that on an outpatient basis with his renal doctor to rule out a  kidney  stone.  Also, Dopplers were obtained to make sure he did not have a DVT  accounting for his fever, and this was negative, and on August 04, 2004, he  was feeling good.  Vital signs stable, afebrile.  Temp 99.9 max.  INR  therapeutic.  Lung sounds clear.  Bowel sounds active.  Neurovascular  intact.  Legs and calves negative.  Dressing is changed.  Wound was benign,  and the patient was subsequently discharged home for followup in the office.   CONDITION ON DISCHARGE:  Improved.   DISCHARGE MEDICATIONS:  1.  Percocet 1-2 q.4-6h. p.r.n. pain.  2.  Robaxin 500 q.8h. p.r.n. spasm.  3.  Coumadin per pharmacy protocol.  4.  Cipro 500 b.i.d. for his UTI.  5.  Norvasc per the medical doctor consult for his hypertension.   FOLLOW UP:  Follow up with Dr. Thomasena Edis in 10 days.  Follow up with Dr. Renato Gails,  his medical doctor, in one week for management of hypertension, and follow  up with his urologist for evaluation for kidney stones.   DICTATED FOR:  Erasmo Leventhal, M.D.      [name]   SJC/MEDQ  D:  08/28/2004  T:  08/28/2004  Job:  [id number]

## 2010-07-10 NOTE — Op Note (Signed)
[facility]  Patient:    [name], [name] Visit Number: [id number] MRN: [id number]          Service Type: MED Location: 1E 0104 01 Attending Physician:  Donnetta Hutching Dictated by:   Vikki Ports, M.D. Proc. Date: 06/09/01 Admit Date:  06/09/2001                             Operative Report  PREOPERATIVE DIAGNOSES: 1. Ruptured diverticulitis. 2. Ventral hernia.  POSTOPERATIVE DIAGNOSES: 1. Ruptured diverticulitis. 2. Peritonitis. 3. Multiple ventral hernias. 4. Left femoral hernia.  PROCEDURE: 1. Exploratory laparotomy. 2. Hartmann resection with mobilization of the splenic flexure. 3. Multiple ventral hernia repairs.  SURGEON:  Vikki Ports, M.D.  ASSISTANT:  Jimmye Norman, M.D.  ANESTHESIA:  General.  DESCRIPTION OF PROCEDURE:  The patient was taken to the operating room and placed in supine position.  After adequate general anesthesia was induced using endotracheal tube, the abdomen was prepped and draped in the normal sterile fashion.  Foley catheter was placed.  Using the previous vertical midline incision, the skin was opened and dissected down onto multiple small, swiss cheese, ventral hernia defects, extending from just below the xiphoid down to the suprapubic region.  All of the hernia defects were reduced.  A number of omental adhesions were taken down.  There was a large amount of purulence and pus within the abdomen and particularly the pelvis.  There was a very redundant sigmoid colon which was very matted and thick and covered with exudate.  The descending colon was mobilized, and the area of soft, normal descending colon was isolated.  Splenic flexure was somewhat mobilized off the peritoneum.  The descending colon was divided using the GIA 75 stapling device.  The mesentery of the sigmoid colon was then taken down.  The patient had such severe inflammation and morbid obesity that the mesentery had virtually  no integrity.  This required a lot of suture ligation of the mesentery.  I dissected down to a portion of proximal rectum which appeared normal, and this was transected using a TA 90 stapling device.  Again, the mesentery was completely hemostatic at the time the sigmoid colon was removed. The descending colon was then mobilized further to create enough length for a colostomy.  The patient was morbidly obese with a very thick abdominal wall; therefore, extensive mobilization of the colostomy had to be performed.  There was multiple defects extending from about just below the xiphoid to above the pubis, the largest of which extended from about 4 cm above the umbilicus to about 5 cm below the umbilicus.  These extended way out lateral past the rectus muscle.  I felt that primary repair with retention sutures was the only option.  A cruciate incision was made in the left lateral rectus muscle after a circular incision was made in the skin.  Colostomy was delivered up and through the rectus muscle right near the oblique aponeurosis.  The abdomen was copiously irrigated.  The distal stump was marked with a 2-0 Prolene suture. Adequate hemostasis was ensured.  The fascia was closed with a running #1 Novofil and multiple #5 Ethibond retention sutures.  The fascia repair was adequate but still lacked significant Integrity secondary to the patients morbid obesity and extensive contamination.  The colostomy was then matured, appeared viable but dusky.  This was accomplished with interrupted 3-0 Vicryl sutures.  The wound was packed open with  Betadine-soaked sponges.  Colostomy dressing was placed.  Sponge and needle counts were correct x 2.  Of note, a left femoral hernia was identified intraperitonealy and reduced; however, the defect was quite deep and involved a lot of inflammation and pus. I opted not to repair this primarily at the time of this operation.  The patient was taken to the PACU in  critical condition. Dictated by:   Vikki Ports, M.D. Attending Physician:  Donnetta Hutching DD:  06/09/01 TD:  06/10/01 Job: [id number]

## 2010-07-10 NOTE — Consult Note (Signed)
[name], MONDAY NO.:  [id number]   MEDICAL RECORD NO.:  [id number]          PATIENT TYPE:  INP   LOCATION:  0002                         FACILITY:  [facility]   PHYSICIAN:  [name], M.D.DATE OF BIRTH:  Apr 06, 1934   DATE OF CONSULTATION:  07/28/2004  DATE OF DISCHARGE:                                   CONSULTATION   REQUESTING PHYSICIAN:  [name], M.D.   REASON FOR CONSULTATION:  Questionable change in heart rhythm during  surgery.   [name] is a 76 year old white male with a past medical history of  hyperlipidemia, BPH, history of renal cell carcinoma,  who underwent a right  total knee replacement electively today.  He currently is being seen in the  PACU postop, still groggy from anesthesia and receiving narcotics.  He is  able to give a very limited history, but from talking to Dr. Thomasena Riggs' PA as  well as the patient and going over the history, the patient had no  complaints in regards to any cardiac issues prior to his surgery.  He  underwent surgery without complications except for noting that he had a  questionable prolongation of his P-R interval during the surgery itself.  The patient had a preop screening several days prior to his surgery.  At  that time, he had an EKG check, which was noted to have a normal sinus  rhythm and a right bundle branch block.  There were no significant ST or T  wave changes noted.  The rest of his lab work was essentially unremarkable.  The patient had no severe complications during surgery.  No episodes of  hypotension.  There was a questionable P-R increase during the surgery and  postop, he was brought to the PACU for recovery.  At that time, [facility] were consulted for evaluation.  The patient himself tells me  that he is feeling lousy and complains of pain in his leg; however, he  denies any chest pain or shortness of breath.  A repeat EKG shows evidence  of same normal sinus  rhythm.  There are some mild sinus arrhythmia but  otherwise is unremarkable.  His left bundle branch block is the same.  Otherwise, there are no significant changes from his other x-rays, other  than noting some flattened T waves in V2 and V3.  The rest of his EKG is  essentially normal.  The patient himself is having no complaints, but I am  not able to get more review of systems secondary to the anesthesia.   The patient's past medical history includes hyperlipidemia, BPH, gout,  history of renal cell carcinoma.  History of ruptured bowel with colostomy  and secondary colostomy reversal.  History of right nephrectomy for a renal  cell carcinoma.  History of partial pneumonectomy.   Patient is currently on medications which have not been changed for a long  time.  He feels me he has not been on any new medications.  His current  medications are gemfibrozil 600 p.o. daily, Colazal 750 p.o. t.i.d.,  Allopurinol 300 p.o. q.h.s., Flomax  0.4 p.o. daily, and Allegra 180 p.o.  daily.   ALLERGIES:  Patient has no known drug allergies.   SOCIAL HISTORY:  He does not smoke.  He drinks on a social basis.   FAMILY HISTORY:  Noncontributory.   PHYSICAL EXAMINATION:  VITAL SIGNS:  Currently, patient is afebrile.  His  blood pressure is 154/87.  He is slightly tachycardic at 101.  O2 sat is 96%  on 2 liters. His respiratory rate is 15.  GENERAL:  The patient is slightly groggy but otherwise alert and oriented x  3.  No apparent distress.  HEENT:  Normocephalic and atraumatic.  Mucous membranes are moist.  NECK:  No carotid bruits.  LUNGS:  Clear to auscultation bilaterally, although he is not taking a deep  inspiration.  HEART:  Regular rate and rhythm.  S1 and S2.  No S3 or S4.  ABDOMEN:  Soft, nontender, nondistended.  Positive bowel sounds.  EXTREMITIES:  No clubbing or cyanosis.  Trace pitting edema.   LAB WORK:  Based on patient's screening labs done on June 1, shows PTT of  39, INR 1.   The patient's electrolytes, renal function, and CBC are normal  with the exception of noting the patient has a mild eosinophilia with an  eosinophil shift at 9.   ASSESSMENT/PLAN:  1.  Status post total knee replacement secondary to severe arthritis,      stable.  Monitor for deep venous thrombosis, as per orthopedic surgery.  2.  Questionable change in the patient's rhythm.  The patient showed signs      of a very mild sinus arrhythmia.  This may be from several factors, the      most common being anesthesia.  It is an issue.  There is no evidence of      any acute ischemia.  He is medically stable.  I would not concern myself      with this leading to further problems.  Would recommend watching his      electrolytes to make sure those are replaced, especially potassium, if      needed, but otherwise patient should be okay, and I see no signs of any      need for a cardiac workup at this time.  3.  Eosinophilia:  Most likely this is from a medication.  Given that these      labs are checked six days ago, the eosinophilic count is mild.  This may      be a chronic reaction to medication.  Reviewing patient's medications,      his Allopurinol is the most likely suspect, followed by Colazal and      gemfibrozil as possibilities for cause.  He may have some mild allergic      reaction.  The patient taking Allopurinol may be known to develop a      hypersensitivity reaction and rash.  Although he tells me he has been on      those medications for years, this may be what is occurring, and I would      not concern myself further.  The fact that he does not have a      significantly elevated white count confirms this decision diagnosis.  In      addition, I note that he is not on Allegra for seasonal allergies, which      may be another component of his eosinophil count, as we are now in      warmer weather  and seeing a higher pollen count, which may be     contributing to this as well.  Again,  I would not concern myself with      this too much unless patient's white count becomes markedly elevated or      he develops a systemic rash.  4.  History of gout, which appears stable.  5.  History of benign prostatic hypertrophy.  6.  History of renal cell carcinoma.       SKK/MEDQ  D:  07/28/2004  T:  07/28/2004  Job:  [id number]   cc:   [name], M.D.  [facility]  [address]  [address]  [place]  [place] [address]  Fax: [phone]

## 2010-07-10 NOTE — Op Note (Signed)
NAME:  [name], [name]                         ACCOUNT NO.:  [id number]   MEDICAL RECORD NO.:  [id number]                   PATIENT TYPE:  INP   LOCATION:  0378                                 FACILITY:  [facility]   PHYSICIAN:  Vikki Ports, M.D.         DATE OF BIRTH:  06-Feb-1935   DATE OF PROCEDURE:  06/06/2002  DATE OF DISCHARGE:                                 OPERATIVE REPORT   PREOPERATIVE DIAGNOSIS:  Ventral hernia.   POSTOPERATIVE DIAGNOSIS:  Ventral hernia.   PROCEDURE:  Laparoscopic ventral hernia repair with mesh.   SURGEON:  Vikki Ports, M.D.   ASSISTANT:  Rose Phi. Maple Hudson, M.D.   ANESTHESIA:  General.   DESCRIPTION OF PROCEDURE:  The patient was taken to the operating room and  placed in the supine position.  After adequate anesthesia was induced using  the endotracheal tube, the abdomen was prepped and draped in normal sterile  fashion.  Using a left subcostal incision I dissected down to the anterior  sheath, which was grasped with a tracheal hook.  A Veress needle was placed  in the abdomen and pneumoperitoneum was obtained with continuous-flow carbon  dioxide.  Then using the Optiview trocar a 10 mm 30 degree scope was placed  in the abdomen.  There were a number of omental and small bowel adhesions to  the anterior abdominal wall and incarcerated into a multitude of Swiss  cheese-type hernia defects.  Additional 11 mm ports were placed in the left  lower quadrant and right upper quadrant under direct visualization.  A  tedious dissection then ensued, carefully using the sharp and blunt  dissection, taking down all adhesions to the anterior abdominal wall.  The  defect was quite large, measuring 18 x 20 cm.  A piece of dual-sided  polyester mesh measuring 25 x 20 cm was then brought onto the field and  tacking 0 Novofil sutures were placed in eight separate areas in the mesh.  It was then placed in the abdomen and brought out to cover the defect  and  tied to the fascia.  The periphery of the mesh after laying very smooth was  then tacked with the Autosuture tacker all around the periphery.  Of note,  there was significant bleeding from one mesenteric vessel in the small  bowel.  This caused loss of about 700 mL of blood, which was evacuated.  This area of mesenteric bleeding was oversewn with an interrupted 2-0 figure-  of-eight silk suture.  This controlled the bleeding, and the abdomen was  irrigated with about 7 or 8 L of fluid.  Because of the significant amount  of irrigation, I opted to place a drain anterior to the mesh through the  skin and hernia sac.  A 19 Blake drain was placed in this fashion and  sutured to the skin using a 2-0 nylon suture.  Incisions were all injected  using Marcaine and closed  with staples.  A sterile dressing and abdominal  binder were placed.  The patient tolerated the procedure well and went to  PACU in good condition.                                               Vikki Ports, M.D.    KRH/MEDQ  D:  06/10/2002  T:  06/11/2002  Job:  [id number]

## 2010-07-10 NOTE — H&P (Signed)
[facility]  Patient:    [name], [name] Visit Number: [id number] MRN: [id number]          Service Type: MED Location: 1E 0104 01 Attending Physician:  Donnetta Hutching Dictated by:   Vikki Ports, M.D. Admit Date:  06/09/2001                           History and Physical  ADMISSION DIAGNOSIS:  Ruptured diverticulitis.  CONDITION ON DISCHARGE:  Serious and critical.  HISTORY OF PRESENT ILLNESS:  The patient is a 76 year old white male with vague abdominal pain for about a week, much worse starting one day prior to presentation to the emergency room.  The patient was seen in the emergency room and found to have an elevated white count of 15.8 thousand and a temperature of 99 degrees.  Heart rate was 145.  The patient underwent CT scan of the abdomen which showed free air as well as very thickened sigmoid colon consistent with sigmoid diverticulitis with ruptured viscus.  I was then consulted to admit and render treatment.  PAST MEDICAL HISTORY: 1. Hypertension. 2. Gout.  PAST SURGICAL HISTORY: 1. Right renal cell cancer, status post right radical nephrectomy in 1994. 2. Bilateral inguinal hernia repairs.  MEDICATIONS:  Norvasc.  Lipitor.  Toprol.  Allopurinol.  Cipro.  Doses are unknown at the time of admission.  REVIEW OF SYSTEMS:  Positive for fever and chills.  Negative for other constitutional symptoms.  Positive for significant weight gain over the last one to two years.  SOCIAL HISTORY:  Negative for tobacco or alcohol use.  PHYSICAL EXAMINATION:  VITAL SIGNS:  Temperature 99, heart rate 145, respiratory rate 20, blood pressure 121/83.  Height 6 feet 1 inch, weight is estimated at approximately 300 pounds.  GENERAL:  The patient is in moderate distress, complaining of abdominal pain.  HEENT:  Exam is benign.  Sclerae nonicteric.  NECK:  Supple and soft.  Without masses.  No carotid bruits are noted. Thyroid is  normal.  LUNGS:  Clear to auscultation.  HEART:  Regular rate and rhythm.  Without murmurs, rubs, or gallops.  ABDOMEN:  Massively obese, with a vertical midline incision, multiple small hernia defects throughout the vertical midline incision.  The patient is markedly tender in the left lower quadrant, with rebound and obvious peritonitis.  GU:  Normal circumcised male.  There is a very large panus.  EXTREMITIES:  No clubbing, cyanosis, or edema.  LABORATORY DATA:  White count 15,000.  BMET is within normal limits. Hemoglobin is 17.6.  IMPRESSION: 1. Ruptured diverticulitis. 2. Morbid obesity. 3. Multiple ventral hernias. 4. Hypertension.  PLAN:  Admission.  IV antibiotics.  Emergent laparotomy with probable Hartmann resection and ventral hernia repair. Dictated by:   Vikki Ports, M.D. Attending Physician:  Donnetta Hutching DD:  06/09/01 TD:  06/10/01 Job: [id number]

## 2010-07-10 NOTE — Discharge Summary (Signed)
   NAME:  [name], [name]                         ACCOUNT NO.:  [id number]   MEDICAL RECORD NO.:  [id number]                   PATIENT TYPE:  INP   LOCATION:  0378                                 FACILITY:  [facility]   PHYSICIAN:  Vikki Ports, M.D.         DATE OF BIRTH:  December 13, 1934   DATE OF ADMISSION:  06/06/2002  DATE OF DISCHARGE:  06/11/2002                                 DISCHARGE SUMMARY   ADMISSION DIAGNOSES:  Ventral hernia.   DISCHARGE DIAGNOSES:  1. Ventral hernia.  2. Acute blood loss anemia.   HISTORY OF PRESENT ILLNESS:  The patient is a 76 year old white male status  post sigmoid colectomy and colostomy who is status post colostomy takedown  now with a very large ventral hernia.  The patient presents now for  laparoscopic repair of his ventral hernia.  For remainder of the H&P please  see the chart.   HOSPITAL COURSE:  The patient was admitted.  Underwent laparoscopic ventral  hernia repair with mesh complicated by some mesenteric bleeding during  dissection.  Approximately 600-700 mL of blood was lost in the OR.  There  was a significant amount of irrigation and a JP drain was placed anterior to  the mesh through a stab wound.  This drained significant serosanguineous  fluid for approximately 24 hours.  Hemoglobin dropped from 16 preoperative  to 11.4 on postoperative day number two down to 10 on postoperative day  number three.  However, patient was completely asymptomatic.  He had very  little low grade fever and underwent urinalysis and chest x-ray.  By  postoperative day number five his fever was gone.  He was feeling much  better and was ready for discharge home.   Post discharge it was noted the patient had a Klebsiella pneumoniae urinary  tract infection and as an outpatient was started on appropriate antibiotic  therapy.   DISCHARGE MEDICATIONS:  1. Percocet for pain.  2. Iron sulfate.   FOLLOW UP:  With me in five days.  The patient is  discharged to home.                                               Vikki Ports, M.D.    KRH/MEDQ  D:  06/18/2002  T:  06/18/2002  Job:  [id number]

## 2010-07-10 NOTE — Op Note (Signed)
NAMEKAMAL, JURGENS NO.:  [id number]   MEDICAL RECORD NO.:  [id number]          PATIENT TYPE:  INP   LOCATION:  2908                         FACILITY:  [facility]   PHYSICIAN:  John C. Madilyn Fireman, M.D.    DATE OF BIRTH:  [birth year]   DATE OF PROCEDURE:  11/10/2005  DATE OF DISCHARGE:  11/12/2005                                 OPERATIVE REPORT   PROCEDURE:  Small bowel capsule endoscopy.   INDICATIONS FOR PROCEDURE:  GI bleeding of obscure origin.   RESULTS:  Gastric passage time 19 minutes, small bowel passage time 5 hours  and 23 minutes.  There was one clean based duodenal ulcer with no stigma of  hemorrhage in the bulb of the duodenum. There was a single small AVM  somewhere in the jejunum with no stigma of hemorrhage, otherwise, this was a  normal examination. No blood was seen in the examined area.   IMPRESSION:  1. Small duodenal ulcer.  2. Single small bowel AVM with no stigma of hemorrhage.   PLAN:  Expectant management for now and will consider repeat colonoscopy,  small bowel enteroscopy, etc., in the event of future bleeding.           ______________________________  Everardo All. Madilyn Fireman, M.D.     JCH/MEDQ  D:  11/15/2005  T:  11/16/2005  Job:  [id number]

## 2010-07-10 NOTE — Discharge Summary (Signed)
   NAME:  [name], [name]                         ACCOUNT NO.:  [id number]   MEDICAL RECORD NO.:  [id number]                   PATIENT TYPE:  INP   LOCATION:  0453                                 FACILITY:  [facility]   PHYSICIAN:  Vikki Ports, M.D.         DATE OF BIRTH:  May 05, 1934   DATE OF ADMISSION:  06/25/2002  DATE OF DISCHARGE:  06/29/2002                                 DISCHARGE SUMMARY   ADMISSION DIAGNOSES:  Status post ventral hernia repair with intra-abdominal  abscess.   DISCHARGE DIAGNOSES:  1. Status post ventral hernia repair with intra-abdominal abscess.  2. Status post percutaneous drainage.   CONDITION ON DISCHARGE:  Good and improved.   DISPOSITION:  He is discharged to home.   FOLLOW UP:  With me in one week following discharge.   HISTORY OF PRESENT ILLNESS:  The patient is a 76 year old white male, well  known to me, status post Gertie Gowda procedure a year ago with takedown and  then ventral hernia repair, some postoperative intra-abdominal bleeding, who  now presents with diarrhea, abdominal pain.  CT scan performed in the  emergency room showed three separate fluid collections in the abdomen.  For  the remainder of the H&P, please see the chart.   HOSPITAL COURSE:  He was admitted, underwent CT-guided drainage, was placed  on IV antibiotics.  Most of this looked like old hematoma and had very few  white cells.  The patient was continued on IV antibiotics.  Temperature was  100.1.  Hemoglobin remained stable at 10 and white count decreased from 16.5  to 9.4 within 2-3 days.  Drain care was taught to the patient.  Follow-up CT  scan prior to discharge showed the collections were still present but looked  smaller.   DISCHARGE MEDICATIONS:  He is discharged to home on Augmentin 875 mg b.i.d.                                               Vikki Ports, M.D.    KRH/MEDQ  D:  07/25/2002  T:  07/25/2002  Job:  [id number]

## 2010-07-10 NOTE — Op Note (Signed)
[name], [name]               ACCOUNT NO.:  [id number]   MEDICAL RECORD NO.:  [id number]          PATIENT TYPE:  INP   LOCATION:  0002                         FACILITY:  [facility]   PHYSICIAN:  Erasmo Leventhal, M.D.DATE OF BIRTH:  [birth year]   DATE OF PROCEDURE:  07/28/2004  DATE OF DISCHARGE:                                 OPERATIVE REPORT   Prior to surgical intervention, in the holding area, the patient asked me to  remove a skin lesion from the anterior aspect of his left knee.  This skin  lesion could be benign.  It was directly over the area of his planned  surgical incision.  I told him we would ellipse this and send it for biopsy,  and he wishes to proceed with that.   PREOPERATIVE DIAGNOSES:  1.  Left knee end-stage osteoarthritis.  2.  Anterior knee tibial skin lesion.   POSTOPERATIVE DIAGNOSES:  1.  Left knee end-stage osteoarthritis.  2.  Anterior knee tibial skin lesion.   PROCEDURE:  1.  Left total knee arthroplasty.  2.  Excisional biopsy of skin lesion, anterior tibia, knee.   SURGEON:  Erasmo Leventhal, M.D.   ASSISTANT:  Jaquelyn Bitter. Chabon, PA-C.   ANESTHESIA:  General with femoral nerve block.   ESTIMATED BLOOD LOSS:  Less than 100 cc.   DRAINS:  Two medium Hemovac.   COMPLICATIONS:  None.   TOURNIQUET TIME:  One hour and 30 minutes at 350 mmHg.   DISPOSITION:  To the PACU stable.   PATHOLOGIC SPECIMENS:  Sent for pathologic review.   OPERATIVE IMPLANTS:  DePuy Sigma knee rotating platform, size 5 femur, size  5 tibia, a 5 x 15 mm tibial rotating platform insert and a 38 mm patella.   OPERATIVE DETAILS:  Patient was counseled in the holding area.  The above  note was discussed.  A femoral nerve block was administered in the holding  area.  An IV had been started.  Antibiotics had been given.  Placed in  supine position.  General anesthesia.  Foley catheter was placed, utilizing  sterile technique by the OR circulating nurse.  All  extremities were well  padded and bumped.  The left knee was elevated.  It was examined.  He had a  5 degree flexion contracture.  He could flex to 125 degrees.  Elevated and  prepped with Duraprep.  Draped in a sterile fashion.  Exsanguinated and  esmarched.  The tourniquet was inflated to 350 mmHg.  An anterior skin  incision was made through this.  This was also to incorporate into an  excisional biopsy and ellipsed the tibial skin lesion.  This was done and  sent for pathologic review.  Had a benign appearance.  There also appeared  to be no active infection in this region.  Medial and lateral soft tissue  flaps were developed, __________ coagulated.  A medial parapatellar  arthrotomy was performed and possible medial soft tissue release was done.  The knee was flexed, the patella was everted.  End-stage arthritic change  with bone-against-bone contact.  The cruciate ligaments were  resected.  A  starter hole made in the distal femur.  The canal was irrigated.  The  effluent was clear.  Intramedullary awl was gently placed and chose a 5  degree valgus cut.  Took an 11 mm cut off the distal femur.  The distal  femur was found to be a size #5.  Rotational marks were made.  A cutting  block was applied, and the distal femur was cut to fit a size #5.  Medial  and lateral menisci remnants were removed under direct visualization.  Geniculate vessels were coagulated.  Posterior neurovascular structures were  __________ off and protected throughout the entire case.  Osteophytes were  removed from the proximal medial tibia.  Tibial eminences were resected.  The proximal tibia was found to be a size 5.  The starting hole was raised.  The stepper was utilized.  Intramedullary canal was irrigated.  The  intramedullary rod was gently placed.  I chose to initially take a 10 mm cut  based upon the lateral side, which was the least deficit side, and this was  done in a 0 degree slope.  Posterior medial  and posterior femoral osteophyte  were removed under direct visualization, and the femoral box was then cut.  At this time, the size 5 femur and size 5 tibia were okay in flexion but  tight in extension.  Based upon that, the trials were removed, and took  another 10 mm off the proximal tibia.  At this time, with a size 5 femur,  size 5 tibia with a 10 mm insert, we had good range of motion and balance.   The patella is found to be a size 38.  It was resected 9 mm.  Locking holes  were made.  At this time, we had a well-balanced tracking knee.  The knee  was then flexed.  The tibial punch and keel were then performed in a  standard fashion.  Utilizing modern cement technique, the components were  cemented into place after copious pulsatile lavage of the knee, a size 5  femur and a size 5 tibia with a 38 patella.  After the cement had cured,  excess cement was removed.  We did a trial of 10, 12.5 and 15 mm.  With a 15  mm tibial insert, we now had well-balanced flexion and extension gaps,  excellent clinical alignment.  The patellofemoral tracking was anatomic.  The trial was removed.  Excess cement was removed.  It was copiously  irrigated.  A final size 5, 15 mm rotating platform tibial insert was  implanted.  Again, we had excellent range of motion, balance, and tracking.  Two Hemovac drains were placed.  Sequential closure of layers were done.  Arthrotomy Vicryl, subcu Vicryl.  The skin was closed with subcuticular  Monocryl suture.  Sterile dressing was applied.  The tourniquet was  deflated.  Normal circulation of the foot and ankle at the end of the case.  The drain was hooked to suction.  We also implanted 20 cc of 0.25% Marcaine  with epinephrine into the knee joint through the drain prior to deflating  the tourniquet.  He had excellent circulation of the ankle at the end of the  case.  He is given 1 mg of Ancef intravenously.  Patient tolerated the procedure well.  There were no  complications or  problems.   For help with technical assistance throughout the entire case, Mr. Brett Canales  Chabon's assistance was needed.  RAC/MEDQ  D:  07/28/2004  T:  07/28/2004  Job:  [id number]

## 2010-07-10 NOTE — Discharge Summary (Signed)
[facility]  Patient:    [name], [name] Visit Number: [id number] MRN: [id number]          Service Type: MED Location: 4N [id number] Attending Physician:  Jetty Duhamel T Dictated by:   Jetty Duhamel, M.D. Admit Date:  07/18/2001 Discharge Date: 07/24/2001                             Discharge Summary  PRIMARY CARE PHYSICIAN:  Fulton Mole, M.D.  DISCHARGE DIAGNOSES: 1. Gastrointestinal bleeding, presumed diverticular source. 2. Intra-abdominal fluid collection/early abscess, spontaneous drainage. 3. Anemia secondary to gastrointestinal blood loss. 4. Hypertension. 5. Renal cell carcinoma in 1995, status post right nephrectomy and right lower    lobectomy. 6. Gout. 7. Benign prostatic hypertrophy. 8. Status post hemorrhoidectomy x2. 9. Status post coccyx fracture.  DISCHARGE MEDICATIONS: 1. Augmentin 875 mg b.i.d. x8 days then stop. 2. Hold Norvasc for one day until followed up by Dr. Renato Gails. 3. Hold Toprol one day until you see Dr. Renato Gails. 4. Hold Flomax, allopurinol, and Lipitor until you see your primary care    physician. 5. Darvocet-N 100 one or two q.6h. p.r.n. pain.  FOLLOWUP: 1. The patient will follow up with Dr. Luan Pulling for surgical followup as    directed. 2. The patient will call Dr. Renato Gails for a recheck in 10 to 14 days.  He will    hold his blood pressure medications and others as noted above until he sees    Dr. Renato Gails.  HISTORY OF PRESENT ILLNESS:  The patient is a 76 year old gentleman who was discharged four weeks prior to his readmission after having a colostomy secondary to ruptured diverticulum.  He presented with a one week history of nausea, vomiting, and progressive weakness.  He noted a two day history of dark red stools in his colostomy bag.  He had been seen by Dr. Luan Pulling in the office who diagnosed him with GI bleeding, and asked his primary care physician to admit the patient.  HOSPITAL COURSE:  #1 -  GASTROINTESTINAL BLEEDING:  The patient was admitted to the hospital. Initial hemoglobin was noted to be 9.5.  GI was consulted for evaluation. Esophagogastroduodenoscopy was performed on 07/19/01, by Dr. Danise Edge. This was in fact noted to be normal.  Surgery was consulted because of their familiarity with the patient and asked to continue to see him.  They cared for his recent colostomy and issues relating to such.  In that the esophagogastroduodenoscopy failed to reveal any source of bleeding, a CT scan was obtained.  CT scan actually revealed a pelvic fluid collection which was felt to be possibly hematoma, but most likely early abscess.  Temperature was noted to increase to 101.  As a result, the patient was placed on IV antibiotics.  Surgery and medicine consulted and decided to proceed with conservative care initially.  Hemoglobin was followed closely.  The patient did require transfusion for his GI bleed, but this did not stabilize.  At the time of discharge, there was no evidence of ongoing bleeding.  From a GI bleeding standpoint, the patient was cleared for discharged on 07/24/01, with a hemoglobin stable at 9.0.  #2 - INTRA-ABDOMINAL FLUID COLLECTION:  After CT scan as noted above, it was felt that this intra-abdominal fluid collection likely represented an early abscess formation.  The patient was placed on IV antibiotics and followed closely.  With IV antibiotic administration, the patient began to defervesce and in  general began to feel better.  Shortly after discovery of the intra-abdominal fluid collection, a serosanguineous type drainage of a large volume began to be appreciated through the patients wound.  CT scan to re-evaluate the intra-abdominal fluid collection was obtained, and did in fact reveal that it had greatly diminished in size.  As a result, it was felt that this drainage from the patients wound likely represented spontaneous drainage of the  intra-abdominal fluid collection.  A decision was made to convert the patient to p.o. antibiotics and he tolerated this well.  As a result, the patient was cleared for discharge with the blessing of the surgical service. He will follow up for re-evaluation of his colostomy, as well as resolution of intra-abdominal fluid collection as directed by the surgical service.  #3 - HYPERTENSION:  During hospitalization, the patients blood pressure was well controlled without any agents.  This was likely secondary to a significant anemia.  Antihypertensives were held.  The patient was instructed to hold all antihypertensives until such time that he follows up with his regular physician.  Should his anemia continue to be stable, and should his blood pressure have indeed increased, it would be prudent to restart some of his blood pressure agents. Dictated by:   Jetty Duhamel, M.D. Attending Physician:  Jetty Duhamel T DD:  08/31/01 TD:  09/03/01 Job: [id number]

## 2010-07-10 NOTE — Discharge Summary (Signed)
   NAME:  [name], [name]                         ACCOUNT NO.:  [id number]   MEDICAL RECORD NO.:  [id number]                   PATIENT TYPE:  INP   LOCATION:  0375                                 FACILITY:  [facility]   PHYSICIAN:  Vikki Ports, M.D.         DATE OF BIRTH:  Sep 19, 1934   DATE OF ADMISSION:  11/29/2001  DATE OF DISCHARGE:  12/04/2001                                 DISCHARGE SUMMARY   ADMISSION DIAGNOSIS:  History of diverticulitis status post Hartmann  resection.   DISCHARGE DIAGNOSIS:  History of diverticulitis status post Hartmann  resection.   CONDITION ON DISCHARGE:  Good and improved.   DISPOSITION:  The patient is discharge to home.   FOLLOW-UP:  With me five days after discharge.   PROCEDURES:  Closure of colostomy on November 29, 2001.   BRIEF HISTORY OF PRESENT ILLNESS:  The patient is a 76 year old white male  who is now six months status post Hartmann resection and colostomy.  The  patient now presents for take-down.  The patient underwent bowel prep at  home and presents now for take-down of colostomy.  For the remainder of the  H&P, please see the chart.   HOSPITAL COURSE:  The patient was admitted, taken to the operating room  where he underwent take-down of colostomy and repair of multiple ventral  hernias.  The patient tolerated the procedure well and postoperatively was  maintained on IV fluids.  NG tube was removed on postoperative day #1.  He  was ambulating.  He began having bowel sounds on postoperative day #2.  By  postoperative day #4 he was advanced to a regular diet, IV was decreased,  and he was ready for discharge home on postoperative day #5.   MEDICATIONS AT TIME OF DISCHARGE:  Tylenol only for pain - the patient asked  that he not receive any narcotic medications.                                               Vikki Ports, M.D.    KRH/MEDQ  D:  12/12/2001  T:  12/12/2001  Job:  [id number]

## 2010-07-10 NOTE — Op Note (Signed)
NAME:  [name], [name]                         ACCOUNT NO.:  [id number]   MEDICAL RECORD NO.:  [id number]                   PATIENT TYPE:  AMB   LOCATION:  ENDO                                 FACILITY:  [facility]   PHYSICIAN:  John C. Madilyn Fireman, M.D.                 DATE OF BIRTH:  Sep 05, 1934   DATE OF PROCEDURE:  11/28/2002  DATE OF DISCHARGE:                                 OPERATIVE REPORT   PROCEDURE:  Colonoscopy.   INDICATIONS FOR PROCEDURE:  The patient with multiple abdominal surgeries,  who has had some recent rectal bleeding and has had no prior colon  screening.  He has also had left lower quadrant abdominal pain.   DESCRIPTION OF PROCEDURE:  The patient was placed in the left lateral  decubitus position and placed on the pulse monitor with continuous low-flow  oxygen delivered by nasal cannula.  He was sedated with 75 mcg IV fentanyl  and 8 mg IV Versed.  The Olympus video colonoscope was inserted into the  rectum and advanced to the cecum, confirmed by transillumination at  McBurney's point and visualization of the ileocecal valve and appendiceal  orifice.  The prep was excellent.  The cecum appeared normal with no masses,  polyps, diverticula, or other mucosal abnormalities.  Within the ascending  colon, there were two small 6-8 mm polyps fulgurated by hot biopsy.  The  remainder of the ascending colon appeared normal.  Within the transverse and  what was felt to be the descending colon, there were a few scattered  diverticula.  At 30 cm, there was clearly seen circumferential narrowing,  representing the previous surgical anastomosis, and there was a short blind  pouch just distal to this anastomosis.  The diameter was widely patent, and  this did not appear to represent any significant stricture.  For about 10 cm  proximal to the anastomosis, there was erythema and granularity and some  friability and edema consistent with fairly mild-appearing colitis.  Biopsies were  taken of this area.  The mucosa distal to the anastomosis down  to the anus was essentially normal.  The scope was then withdrawn, and the  patient returned to the recovery room in stable condition.  He tolerated the  procedure well, and there were no immediate complications.    IMPRESSION:  1. Two small ascending colon polyps.  2. Questionable colitis near the anastomosis.   PLAN:  We will await all biopsy results.                                               John C. Madilyn Fireman, M.D.    JCH/MEDQ  D:  11/28/2002  T:  11/28/2002  Job:  [id number]   cc:   Molly Maduro A. Nicholos Johns, M.D.  [address]  [place]  [place] [address]  Fax: [phone]   Vikki Ports, M.D.  [phone] [address], [address]  [place]  [place] [address]  Fax: [phone]

## 2010-07-10 NOTE — Op Note (Signed)
NAMEJOHATHON, OVERTURF NO.:  [id number]   MEDICAL RECORD NO.:  [id number]          PATIENT TYPE:  INP   LOCATION:  2908                         FACILITY:  [facility]   PHYSICIAN:  Petra Kuba, M.D.    DATE OF BIRTH:  08/23/1934   DATE OF PROCEDURE:  11/07/2005  DATE OF DISCHARGE:                                 OPERATIVE REPORT   PROCEDURE:  Esophagogastroduodenoscopy with biopsy.   INDICATIONS:  Probable upper GI bleeding.  Consent was signed after risks,  benefits, methods, options thoroughly discussed with both the patient and  his wife.   MEDICINES USED:  Fentanyl 37.5, Versed 4.   PROCEDURE:  The video endoscope was inserted by direct vision.  The  esophagus was normal.  In the distal esophagus, there was small hiatal  hernia with a widely patent ring.  Initially, no heme was seen there.  Scope  passed into the stomach and some old food and liquid was seen.  We carefully  suctioned some the liquid.  There were flecks of blood being seen.  Scope  advanced through a normal antrum, normal pylorus, into the duodenal bulb  where 2 distal bulb ulcers were seen.  One had some black material on it  which were flat black spot.  The ulcers were washed and watched and could  not be made to bleed.  There was no visible vessel at-risk of any bleeding  stigmata.  Scope was advanced around the C-loop to a normal second portion  of the duodenum.  No blood was seen distally.  Scope was withdrawn back to  the bulb, and again these lesions were washed and watched and could not be  made to bleed.  Scope was withdrawn back to the stomach which was evaluated  on straight and retroflexed visualization.  We could not see underneath the  proximal greater curve were the food was, but again no signs of active  bleeding or any other abnormalities were seen.  Some the water was  suctioned, but we continued to clog the scope with debris.  We went ahead  and took 2 biopsies of the proximal  stomach to the antrum to rule out  Helicobacter.  At that point, the patient did begin to regurgitate and  belch, and the head of his bed was elevated no signs of aspiration were  seen.  On slow withdrawal back through the esophagus, there was a little  trauma to the ring with a little bit of bright red blood but no signs of  active bleeding.  The patient had no further belching or regurgitation.  We  went ahead and looked at the bulb one more time just to confirm no signs of  active bleeding, and again on slow withdrawal after suctioning a little air  and water, the ring did not have signs of active bleeding, just a little  fresh blood from trauma.  The rest of the esophagus was normal.  Scope was  removed.  The patient tolerated the procedure well.  There was no obvious  immediate complication.   ENDOSCOPIC DIAGNOSES:  1.  Small hiatal hernia with a thin fibrous ring and a little bit of the      scope trauma from regurgitation or vomiting.  2. Old food in the proximal stomach not well seen.  3. Two bulb duodenal ulcers.  No active bleeding, one with a flat black      spot.  4. Otherwise normal EGD status post stomach biopsies to rule out      Helicobacter.   PLAN:  Hold aspirin for now.  Continue pump inhibitors.  Clear liquids only.  Transfuse p.r.n., watching BUN, color and number of stools and repeat EGD  p.r.n.  If there is some concern down the road for continual bleeding  from a second source, either nuclear bleeding scan, colonoscopy, capsule  endoscopy or endoscopy using the peds colonoscope would be indicated but  believe the ulcers are his site based on having some minimal hematemesis as  well as black tarry stools at home for 2 days prior to coming in.           ______________________________  Petra Kuba, M.D.     MEM/MEDQ  D:  11/07/2005  T:  11/08/2005  Job:  [id number]   cc:   Molly Maduro A. Nicholos Johns, M.D.  John C. Madilyn Fireman, M.D.

## 2010-07-10 NOTE — H&P (Signed)
   NAME:  [name], [name]                         ACCOUNT NO.:  [id number]   MEDICAL RECORD NO.:  [id number]                   PATIENT TYPE:  INP   LOCATION:  NA                                   FACILITY:  [facility]   PHYSICIAN:  Vikki Ports, M.D.         DATE OF BIRTH:  1934-09-26   DATE OF ADMISSION:  06/06/2002  DATE OF DISCHARGE:                                HISTORY & PHYSICAL   ADMISSION DIAGNOSES:  Ventral hernia.   HISTORY OF PRESENT ILLNESS:  The patient is a 76 year old white male status  post ruptured diverticulitis and take down of his colostomy with multiple  ventral hernias.  The patient has been having increasing symptoms over the  last month and now presents for laparoscopic, possible open ventral hernia  repair.   PAST MEDICAL HISTORY:  Significant for hypertension and gout and right renal  cell cancer.   PAST SURGICAL HISTORY:  Significant as above as well as right radical  nephrectomy and bilateral inguinal hernia repairs.   MEDICATIONS:  1. Norvasc 5 mg p.o. daily.  2. Allopurinol.  3. Toprol 25 mg daily.  4. Lipitor 10 mg daily.   REVIEW OF SYSTEMS:  Negative for kinds of obstruction.  Are significant for  abdominal discomfort near the ventral hernias.  A 15-point review of systems  is negative for any pertinent positives.   SOCIAL HISTORY:  He lives with his wife, is retired, and does not use  tobacco or alcohol.   PHYSICAL EXAMINATION:  VITAL SIGNS:  Weight 236 pounds, temperature 98.6,  heart rate 90, respiratory rate 16, blood pressure 128/92.  GENERAL:  The patient is an age appropriate white male in no distress.  HEENT:  Sclerae nonicteric.  Pupils are equal, round, reactive to light.  Mucous membranes are moist without evidence of nasal or oral mucosal  lesions.  NECK:  Soft and suppler without carotid bruit or thyromegaly or cervical  adenopathy.  LUNGS:  Clear to auscultation and percussion bilaterally.  HEART:  Regular rate and  rhythm without murmur, rub, or gallop.  ABDOMEN:  Soft with a large ventral hernia in the midline.  There is no  hepatosplenomegaly.  EXTREMITIES:  No clubbing, cyanosis, edema.   IMPRESSION:  Ventral hernia status post ruptured diverticulitis and take  down of Hartmann.   PLAN:  Laparoscopic ventral hernia repair with mesh.                                              Vikki Ports, M.D.   KRH/MEDQ  D:  06/06/2002  T:  06/06/2002  Job:  [id number]

## 2010-07-10 NOTE — H&P (Signed)
[name], METER               ACCOUNT NO.:  [id number]   MEDICAL RECORD NO.:  [id number]          PATIENT TYPE:  INP   LOCATION:  4731                         FACILITY:  [facility]   PHYSICIAN:  Deirdre Peer. Polite, M.D. DATE OF BIRTH:  May 18, 1934   DATE OF ADMISSION:  11/20/2005  DATE OF DISCHARGE:                                HISTORY & PHYSICAL   CHIEF COMPLAINT:  Abdominal pain.   HISTORY OF PRESENT ILLNESS:  A 76 year old male with multiple medical  problems presents to the ED with complaints of abdominal pain.  Of note, the  patient recently has been discharged from the hospital 09/21 after extensive  evaluation for GI bleed.  The patient presented to the ED with the above  chief complaint, started earlier today.  The patient denies any emesis, but  some nausea and denies any diarrhea, denies any bleeding episodes.  Because  of the patient's concern and recent hospitalization for GI bleed, the  patient presented acutely to the ED with this abdominal discomfort.  At the  ED, the patient was evaluated, had abdominal series, which showed air-fluid  levels and a nondilated small bowel __________ questionable regional ileus.  The patient has a CBC within normal limits, hemoglobin 10.9, white count 7.5  BMET was within normal limits except for a creatinine 1.6.  UA negative.  __________ was called for further evaluation.  At the time of my evaluation,  the patient is alert and oriented x3, no apparent distress.  Still with some  vague abdominal discomfort.  The patient states his last bowel movement was  today, normal character.  His p.o. intake has been okay, without any  specific complaints of specific note.  The patient denies any bleeding,  denies any new medicines.  Admission is deemed necessary for further  evaluation and treatment of the ileus.   PAST MEDICAL HISTORY:  Significant for:  1. Recent evaluation for GI bleed.  2. Dyslipidemia.  3. BPH.  4. Gout.  5. Renal cell  carcinoma status post right nephrectomy.  6. History of ruptured bowel with colostomy, now with subsequent reversal      of colostomy.  7. History of partial pneumonectomy.   MEDICATIONS:  On admission include:  1. Protonix 40 mg b.i.d.  2. Colazal 750 mg 3 times a day.  3. Tricor daily.  4. Flomax daily.  5. Allegra 180 mg daily.  6. Allopurinol 300 mg daily.  7. Avodart 0.5 mg daily.   SOCIAL HISTORY:  Negative for tobacco, alcohol or drugs.   PAST SURGICAL HISTORY:  As stated above.   ALLERGIES:  None.   FAMILY HISTORY:  Noncontributory.   REVIEW OF SYSTEMS:  As stated in HPI.   PHYSICAL EXAMINATION:  VITAL SIGNS:  Stable, afebrile.  HEENT:  Within normal limits.  CHEST:  Clear without rales or rhonchi.  CARDIOVASCULAR:  Regular.  ABDOMEN:  Soft, positive bowel sounds.  Slight discomfort to deep palpation.  No mass appreciated.  EXTREMITIES:  No edema.  NEUROLOGIC:  Nonfocal.   DATA:  As stated in HPI.   ASSESSMENT:  1. Abdominal pain  with x-ray suggesting ileus in a patient with multiple      abdominal surgeries.  2. History of GI bleed with extensive evaluation in 09/07.  3. History of renal cell carcinoma status post right nephrectomy.  4. High cholesterol.  5. Benign prostatic hypertrophy.  6. History of ruptured bowel with colostomy with subsequent reversal of      colostomy.  7. History of partial pneumonectomy.   RECOMMENDATIONS:  The patient be admitted to the telemetry floor.  The  patient will be made NPO, will have followup electrolytes, start on IV  fluids,  PPI, will followup results of CAT scan.  Pending results, surgical  consultation may be entertained versus further conservative management.  Will make further recommendations as deemed necessary.      Deirdre Peer. Polite, M.D.  Electronically Signed     RDP/MEDQ  D:  11/20/2005  T:  11/21/2005  Job:  [id number]   cc:   Molly Maduro A. Nicholos Johns, M.D.

## 2010-07-10 NOTE — Consult Note (Signed)
NAMEBIANCA, VESTER NO.:  [id number]   MEDICAL RECORD NO.:  [id number]          PATIENT TYPE:  INP   LOCATION:  2908                         FACILITY:  [facility]   PHYSICIAN:  Petra Kuba, M.D.    DATE OF BIRTH:  01-10-1935   DATE OF CONSULTATION:  DATE OF DISCHARGE:                                   CONSULTATION   HISTORY:  Patient known to Dorena Cookey with a long GI history who actually  called me last night saying he was having black tarry stools, near syncope  when he went to the bathroom and maybe a little bit of hematemesis and I  recommended going to the ER, we had decided to see him at [facility] and  orders were called in.  Later last night, I called [facility] and there was  no sign of him.  This morning, I tried to find him in the computer,  unfortunately I had the wrong spelling of his name, but later in the morning  Dr. Rito Ehrlich called me from a GI standpoint and he was admitted and  requested a GI consult.  He has continued to have some melena, but no  syncope, did have some heartburn and reflux on Wednesday, took a few Tums,  had been on a baby aspirin up until the other day, but does not use any  extra aspirin or anti-inflammatories.  He has not had any previous upper GI  problems.   PAST MEDICAL HISTORY:  Pertinent for a history of:  1. Renal cell carcinoma.  2. He has also had diverticular surgery.  3. BPH.  4. Gout.  5. Increased cholesterol.  6. He has also had hernia repairs with the diverticular surgery and ostomy      and reversal of the ostomy.  7. He has also had a hemorrhoidectomy.   FAMILY HISTORY:  Negative for any GI problems.   CURRENT MEDICINES AT HOME:  Include Avodart, Allegra, Flomax, Tricor and  Colazal.   ALLERGIES:  VICODIN.   REVIEW OF SYSTEMS:  Negative except above.   PHYSICAL EXAM:  GENERAL:  No acute distress, lying comfortable in the bed.  VITAL SIGNS:  Stable, afebrile except for being tachycardic and  slightly low  blood pressure.   LABORATORY DATA:  Hemoglobin was 9.4 with normal white count, platelet count  and MCV, BUN of 57, creatinine 1.2, albumin 2.3, other electrolytes are  fine, PT 15.5, hemoglobin with hydration has dropped to 7.9.   ASSESSMENT:  1. Multiple medical problems.  2. GI bleeding, probably upper in a patient on aspirin a day.   PLAN:  The risks, benefits and methods of endoscopy were discussed and will  proceed ASAP with further workup and plans pending those findings.           ______________________________  Petra Kuba, M.D.     MEM/MEDQ  D:  11/07/2005  T:  11/07/2005  Job:  [id number]   cc:   Molly Maduro A. Nicholos Johns, M.D.  Hollice Espy, M.D.

## 2010-07-10 NOTE — Op Note (Signed)
[name], [name] NO.:  [id number]   MEDICAL RECORD NO.:  [id number]          PATIENT TYPE:  INP   LOCATION:  2908                         FACILITY:  [facility]   PHYSICIAN:  Graylin Shiver, M.D.   DATE OF BIRTH:  [birth year]   DATE OF PROCEDURE:  11/08/2005  DATE OF DISCHARGE:                                 OPERATIVE REPORT   PROCEDURE:  Esophagogastroduodenoscopy and enteroscopy.   INDICATION:  Gastrointestinal bleeding.  The patient did have an upper  endoscopy yesterday by Dr. Ewing Schlein.  The finding was that of 2 duodenal ulcers  which had clean bases.  The patient continued to have significant  gastrointestinal bleeding last evening and was hypotensive.  He received  numerous units of blood.  A GI bleeding scan was done which showed a  positive area of bleeding in what was felt to be in the mid small bowel  area.  The patient reported that he has been passing reddish stools and  states he did vomit up some coffee-grounds appearing material last evening.  Repeat endoscopy is being done to evaluate.   Informed consent was obtained after explanation of the risks of bleeding,  infection and perforation.   PREMEDICATION:  Fentanyl 50 mcg IV, Versed 5 mg IV.   DESCRIPTION OF PROCEDURE:  With the patient in the left lateral decubitus  position, the Olympus gastroscope was inserted into the oropharynx and  passed into the esophagus.  It was advanced down the esophagus and into the  stomach and into the duodenum.  I was able to advance the scope down into  the third portion of the duodenum.  I saw no abnormalities in the distal or  second portion of the duodenum.  In the bulb, there were two ulcers; one was  a linear ulcer that was clean based; the other was an 8-mm deeper ulcer  which also had a clean base.  I saw no evidence of any stigmata of bleeding  on these ulcers, and no blood in the duodenum at all.  The stomach showed a  little bit of erythema.  No specific  lesions were seen to explain bleeding.  The esophagus looked normal.  After the endoscope was removed, I then  inserted a pediatric colonoscope and advanced it down well into the jejunum  looking for a potential source of bleeding, I saw no abnormalities in the  small bowel.  He tolerated the procedure well without complications.   IMPRESSION:  Two duodenal ulcers.  No evidence of visible stigmata of  bleeding.   PLAN:  I will schedule the patient for an abdominal angiogram to see if we  can localize the site of bleeding in the more distal small bowel           ______________________________  Graylin Shiver, M.D.     SFG/MEDQ  D:  11/08/2005  T:  11/09/2005  Job:  [id number]   cc:   Hollice Espy, M.D.  Petra Kuba, M.D.

## 2010-07-10 NOTE — Op Note (Signed)
NAMEJIMMY, [name] NO.:  [id number]   MEDICAL RECORD NO.:  [id number]          PATIENT TYPE:  INP   LOCATION:  2908                         FACILITY:  [facility]   PHYSICIAN:  Graylin Shiver, M.D.   DATE OF BIRTH:  1934/09/29   DATE OF PROCEDURE:  11/10/2005  DATE OF DISCHARGE:                                 OPERATIVE REPORT   INDICATIONS FOR PROCEDURE:  Gastrointestinal bleeding.   Informed consent was obtained after explanation of the risks of bleeding,  infection and perforation.   PREMEDICATION:  Fentanyl 75 mcg IV, Versed 7.5 mg IV.   PROCEDURE:  With the patient in the left lateral decubitus position a rectal  exam was performed.  No masses were felt.  The Olympus colonoscope was  inserted into the rectum and advanced around the colon to the cecum.  It was  then advanced into the terminal ileum. The terminal ileum looked normal.  I  saw no evidence of blood. The cecum looked normal and I saw no evidence of  blood.  There were scattered diverticula noted in the ascending colon,  transverse colon, and left colon but I did not see any stigmata of bleeding.  There was a surgical anastomosis at 25 cm from prior sigmoid resection.  This looked fine and I saw no obvious sites of bleeding or active bleeding  on this exam. The rectum looked normal.  He tolerated the procedure well  without complications.   IMPRESSION:  1. Diverticulosis.  2. No sign of active bleeding or stigmata of bleeding during this      examination.   PLAN:  The patient is going to have a capsule endoscopy.           ______________________________  Graylin Shiver, M.D.     SFG/MEDQ  D:  11/10/2005  T:  11/12/2005  Job:  [id number]   cc:   Deirdre Peer. Polite, M.D.  John C. Madilyn Fireman, M.D.

## 2010-07-10 NOTE — Op Note (Signed)
[facility]  Patient:    [name], [name] Visit Number: [id number] MRN: [id number]          Service Type: MED Location: [id number] Attending Physician:  Jetty Duhamel T Dictated by:   Verlin Grills, M.D. Proc. Date: 07/19/01 Admit Date:  07/18/2001   CC:         Molly Maduro A. Eliezer Lofts., M.D.  Vikki Ports, M.D.   Operative Report  PROCEDURE:  Diagnostic esophagogastroduodenoscopy.  INDICATIONS FOR PROCEDURE:  Mr. Lui Bellis is a 76 year old male born [birth year].  In 1994, Mr. Hoeffner underwent a right radical nephrectomy to remove a renal cell carcinoma involving the right kidney.  Mr. Mahone was hospitalized at [facility] from June 09, 2001 to Jun 22, 2001 to treat a perforated sigmoid colon diverticulum complicated by generalized peritonitis. Mr. Moline underwent an exploratory laparotomy, sigmoid colostomy, and Hartmann pouch formation. His postoperative course was complicated by surgical incision line dehiscence, and ischemic colostomy, fistula formation, and bilateral pelvic fluid collections (sterile) which were drained by CT placed catheters.  Mr. Beigel felt well post hospital discharge until approximately a week ago when he developed early satiety, nausea with vomiting and upper abdominal discomfort. He denies gastrointestinal bleeding. He tells me his colostomy has been working well. He denies severe abdominal pain, nonsteroidal anti-inflammatory drug use, or history of peptic ulcer disease.  Mr. Urbanek was evaluated by Dr. Danna Hefty the morning of his hospital admission. His stool was Hemoccult positive and his hemoglobin had dropped to 9.5 gm.  PAST MEDICAL HISTORY:  Bilateral inguinal hernia repairs, 1994 renal cell carcinoma resulting in right radical nephrectomy, gout, hypertension, perforated sigmoid diverticulum with peritonitis.  ALLERGIES:  None.  CHRONIC MEDICATIONS:   1. Flomax.  2. Allopurinol.  3. Lipitor.  4. Norvasc.  5. Toprol.  HABITS:  Mr. Robitaille stopped smoking cigarettes approximately 10 years ago and uses alcohol in moderation.  ENDOSCOPIST:  Verlin Grills, M.D.  PREMEDICATION:  Versed 10 mg, Demerol 50 mg.  ENDOSCOPE:  Olympus gastroscope.  DESCRIPTION OF PROCEDURE:  After obtaining informed consent, Mr. Gorr was placed in the left lateral decubitus position. I administered intravenous Versed and intravenous Demerol to achieve conscious sedation for the procedure. The patients blood pressure, oxygen saturation and cardiac rhythm were monitored throughout the procedure and documented in the medical record.  The Olympus gastroscope was passed through the posterior hypopharynx, into the proximal esophagus without difficulty. The hypopharynx, larynx and vocal cords appeared normal.  ESOPHAGOSCOPY:  The proximal, mid and lower segments of the esophagus appeared normal.  GASTROSCOPY:  Retroflexed view of the gastric cardia and fundus was normal. The gastric body, antrum, and pylorus appeared normal.  DUODENOSCOPY:  The duodenal bulb, mid duodenum, and distal duodenum appeared normal.  ASSESSMENT:  Normal esophagogastroduodenoscopy.  RECOMMENDATIONS:  Discontinue protonix. Schedule CT scan of the abdomen and pelvis. Dictated by:   Verlin Grills, M.D. Attending Physician:  Jetty Duhamel T DD:  07/19/01 TD:  07/20/01 Job: [id number]

## 2010-07-10 NOTE — H&P (Signed)
[name], [name] NO.:  [id number]   MEDICAL RECORD NO.:  [id number]          PATIENT TYPE:   LOCATION:                                 FACILITY:   PHYSICIAN:  Erasmo Leventhal, M.D.DATE OF BIRTH:  1934/03/06   DATE OF ADMISSION:  DATE OF DISCHARGE:                                HISTORY & PHYSICAL   CHIEF COMPLAINT:  Left knee osteoarthritis.   HISTORY OF PRESENT ILLNESS:  This 76 year old gentleman well known to [place]  with a history of end-stage osteoarthritis of his left knee.  He has failed  all conservative management and continues to have pain and difficulty with  daily activities.  After discussion of treatment, risks, benefits, and  options, the patient is now scheduled for a total knee arthroplasty of the  left knee.  Surgery is to go ahead as scheduled.  He has received medical  clearance from his medical doctors, Dr. Nicholos Johns, at [facility]  [place].   PAST MEDICAL HISTORY:  1.  Hypercholesterolemia.  2.  Gout.  3.  Benign prostatic hypertrophy.  4.  History of renal cell carcinoma.   DRUG ALLERGIES:  None.   MEDICATIONS:  1.  Gemfibrozil 600 mg 1 daily.  2.  Colazal 750 mg 1 t.i.d.  3.  Allopurinol 300 mg q.h.s.  4.  Flomax 0.4 mg 1 daily.  5.  Allegra 180 mg 1 daily.   PAST SURGICAL HISTORY:  1.  Ruptured bowel with colostomy and then subsequent colostomy reversal.  2.  Herniorrhaphy.  3.  Right nephrectomy for renal cell carcinoma.  4.  Partial pneumonectomy.  5.  Hemorrhoidectomy.   FAMILY HISTORY:  Positive for cancer.   SOCIAL HISTORY:  The patient is married.  He is retired.  He does not smoke  and drinks occasionally.   REVIEW OF SYSTEMS:  CENTRAL NERVOUS SYSTEM:  Negative for headache, blurred  vision, or dizziness.  PULMONARY:  No shortness of breath, PND, or  orthopnea.  CARDIOVASCULAR:  No chest pain or palpitations.  GI:  Negative  for ulcers or hepatitis.  GU:  Positive for kidney stone and a  nephrectomy  on the right for renal cell carcinoma and benign prostatic hypertrophy with  decreased flow.   PHYSICAL EXAMINATION:  VITAL SIGNS:  BP 130/88, respirations 20, pulse 80  and regular.  GENERAL APPEARANCE:  This is a well-developed and well-nourished gentleman  in no acute distress.  HEENT:  Head is normocephalic.  Nose patent.  Ears patent.  Pupils are  equal, round and reactive to light.  Throat without injection.  NECK:  Supple without adenopathy.  Carotids are 2+ without bruits.  LUNGS:  Clear to auscultation.  No rales or rhonchi.  Respirations 20.  HEART:  Regular rate and rhythm without murmur.  ABDOMEN:  Soft with active bowel sounds.  No mass or organomegaly.  EXTREMITIES:  The left knee to have 0-125 degree range of motion.  There is  no flexion contracture.  There is a varus deformity.  Dorsalis pedis and  posterior tibialis pulses are 2+.  NEUROLOGIC:  Patient  is alert and oriented to time, place, and person.  Cranial nerves II-XII are grossly intact.  SKIN:  Within normal limits.   X-rays show end-stage osteoarthritis of the left knee.   IMPRESSION:  End-stage osteoarthritis of the left knee.   PLAN:  Total knee arthroplasty, left knee.       SJC/MEDQ  D:  07/13/2004  T:  07/13/2004  Job:  [id number]

## 2010-07-10 NOTE — Discharge Summary (Signed)
[facility]  Patient:    [name], [name] Visit Number: [id number] MRN: [id number]          Service Type: SUR Location: [id number] Attending Physician:  Vikki Ports Dictated by:   Vikki Ports, M.D. Admit Date:  06/09/2001 Disc. Date: 06/22/01   CC:         Radene Knee., M.D.   Discharge Summary  ADMISSION DIAGNOSIS:  Ruptured diverticulitis.  DISCHARGE DIAGNOSIS:  Ruptured diverticulitis.  CONDITION ON DISCHARGE:  Good. Stable.  DISPOSITION:  Discharge to home.  DISCHARGE MEDICATIONS: 1. Vicodin one to two p.o. q.4h. p.r.n. pain. 2. Norvasc 10 mg p.o. q.d. 3. Metoprolol 50 mg p.o. q.d. 4. Flomax 0.4 mg p.o. q.d.  DISCHARGE FOLLOWUP:  The patient is to follow up with Dr. Luan Pulling in two weeks.  BRIEF HISTORY OF PRESENT ILLNESS:  The patient is a 76 year old white male admitted with vague abdominal pain which was present for about one week prior to admission to the emergency room. In the emergency room, he was found to have an elevated white count of 16,000 and a temperature of 99 degrees. Heart rate was 145. CT scan showed free air and a thickened sigmoid colon. The patient had peritonitis on exam and therefore, was admitted for surgical intervention.  HOSPITAL COURSE:  The patient was admitted, hydrated, started on IV antibiotics and taken emergently to the operating room. The patient underwent a very difficulty Hartmann resection and repair of ventral hernias. He was maintained on Cefotetan and his colostomy, which was very ischemic, was watched closely. The patient was maintained on PAS hose and Lovenox for DVT prophylaxis. On postoperative day #3, he was having minimal output from his NG tube; it was clamped and then discontinued. He was started on clear liquids which he tolerated for a few days and started on full liquids on postoperative day #7 and he began having some flatus from his colostomy. The  patient began undergoing normal saline wet-to-dry dressings no postoperative day #2 and this was maintained throughout his hospitalization. The patient was maintained on IV antibiotics, had physical therapy rehab. On postoperative day #9, the patient had an elevated white count of 17,000 and a temperature to 101.3. He underwent CT scan which showed two fluid collections in the lower abdomen. These were aspirated and drains were placed by interventional radiology. These collections had no growth of bacteria and appeared just an old bloody irrigation fluid. On postoperative day #11, his PCA was discontinued, his antibiotics were discontinued, he was tolerating a regular diet. On postoperative day #12, the drains were removed. On postoperative day #13, he was ready for discharge. Dictated by:   Vikki Ports, M.D. Attending Physician:  Danna Hefty R. DD:  06/22/01 TD:  06/22/01 Job: [id number]

## 2010-07-10 NOTE — H&P (Signed)
   NAME:  [name], [name]                         ACCOUNT NO.:  [id number]   MEDICAL RECORD NO.:  [id number]                   PATIENT TYPE:  INP   LOCATION:  0001                                 FACILITY[facility]   PHYSICIAN:  Vikki Ports, M.D.         DATE OF BIRTH:  09/18/1934   DATE OF ADMISSION:  11/29/2001  DATE OF DISCHARGE:                                HISTORY & PHYSICAL   ADMISSION DIAGNOSIS:  History of ruptured diverticulitis and Hartmann  procedure.   ANTICIPATED PROCEDURE:  Takedown of Hartmann colostomy.   HISTORY OF PRESENT ILLNESS:  The patient is a 76 year old male, who  presented in April of this year with perforated diverticulitis and  peritonitis.  The patient was taken emergently to the operating room where  he underwent Hartmann resection and primary ventral hernia repair.  The  patient now presents for takedown of his colostomy.   PAST MEDICAL HISTORY:  1. Hypertension.  2. Gout.   PAST SURGICAL HISTORY:  1. Right renal cancer, status post right radical nephrectomy in 1994.  2. Bilateral inguinal hernia repairs.   MEDICATIONS:  Norvasc, Lipitor, Toprol, allopurinol.   REVIEW OF SYSTEMS:  Positive for good function of his ostomy.  Negative for  abdominal pain or signs of small bowel obstruction.   SOCIAL HISTORY:  He lives with his wife and is negative for tobacco or  alcohol use.   PHYSICAL EXAMINATION:  VITAL SIGNS:  His temperature is 98.6.  His heart  rate is 92.  Respiratory rate is 16, and blood pressure is 126/90.  Height  is 61 inches, and weight is approximately 270 pounds.  GENERAL:  The patient is age-appropriate white male in no distress.  HEENT:  Benign.  Sclerae nonicteric.  NECK:  Supple and soft without masses.  There are no carotid bruits noted.  Thyroid is normal.  LUNGS:  Clear to auscultation.  HEART:  Regular rate and rhythm without murmurs, rubs, or gallops.  ABDOMEN:  Well-healed vertical midline incision and a  functioning stoma in  the left lower quadrant.    IMPRESSION:  History of ruptured diverticulitis, status post Hartmann  procedure.   PLAN:  Takedown of Hartmann colostomy and primary closure of ventral hernia.                                               Vikki Ports, M.D.    KRH/MEDQ  D:  11/29/2001  T:  11/29/2001  Job:  [id number]   cc:   Lynnea Ferrier., M.D.  [address]  [address]  [place]  [place] [address]  Fax: [phone]

## 2010-07-10 NOTE — Op Note (Signed)
[name], [name]               ACCOUNT NO.:  [id number]   MEDICAL RECORD NO.:  [id number]          PATIENT TYPE:  INP   LOCATION:  0007                         FACILITY:  [facility]   PHYSICIAN:  Alfonse Ras, MD   DATE OF BIRTH:  [birth year]   DATE OF PROCEDURE:  05/19/2005  DATE OF DISCHARGE:                                 OPERATIVE REPORT   PREOPERATIVE DIAGNOSIS:  Ventral hernia.   POSTOPERATIVE DIAGNOSIS:  Ventral hernia.   PROCEDURE:  Diagnostic laparoscopy and open ventral hernia repair with mesh.   ANESTHESIA:  General   SURGEON:  Alfonse Ras, MD   ASSISTANT:  Lebron Conners, M.D.   DESCRIPTION:  The patient was taken to the operating room and placed in the  supine position after adequate general anesthesia was induced using  endotracheal tube.  The abdomen was prepped and draped in a normal sterile  fashion.  Using a 5-mm, OptiVu trocar, peritoneal access was obtained under  direct vision in the right upper quadrant.  There was significant amount of  omental adhesions as well as small bowel adhesions to the previously placed  ventral hernia repair in the midline.  I could identify the left lower  quadrant hernia defect; and it was marked with a marking pen on the skin.  I  opted to open.  I made a transverse incision in the left lower quadrant  overlying the hernia and dissected down onto the sac.  Pneumoperitoneum was  maintained and the sac was dissected down to the fascia.   The sac was then opened and resected.  There was a significant amount of  small bowel within the hernia; and this was reduced into the abdominal  cavity.  The defect, itself, was only about 3 or 4 cm; and it was closed  primarily with Novofil.  I placed a piece of 6 x 6 atrium mesh over the  repair; and because of the very thin nature of his abdominal wall, I opted  not to put in sutures but I tacked it in  the periphery using the auto  suture tacker.  Drain was placed because of the  large size of the previous  sac and cavity.  The subcutaneous tissue was closed with a running 2-0  Vicryl.  Skin was closed with staples.  The patient tolerated the procedure  well and went to PACU in good condition.      Alfonse Ras, MD  Electronically Signed     KRE/MEDQ  D:  05/19/2005  T:  05/20/2005  Job:  [id number]

## 2010-07-10 NOTE — Op Note (Signed)
NAME:  [name], [name]                         ACCOUNT NO.:  [id number]   MEDICAL RECORD NO.:  [id number]                   PATIENT TYPE:  INP   LOCATION:  0001                                 FACILITY:  [facility]   PHYSICIAN:  Vikki Ports, M.D.         DATE OF BIRTH:  1934-12-21   DATE OF PROCEDURE:  11/29/2001  DATE OF DISCHARGE:                                 OPERATIVE REPORT   PREOPERATIVE DIAGNOSIS:  Colostomy, status post Hartmann resection.   POSTOPERATIVE DIAGNOSIS:  Colostomy, status post Hartmann resection, with  ventral hernia and parastomal hernia.   PROCEDURE:  Takedown of colostomy with partial resection and primary repair  of parastomal and ventral hernias.   SURGEON:  Vikki Ports, M.D.   ASSISTANT:  Thornton Park. Daphine Deutscher, M.D.   ANESTHESIA:  General.   DESCRIPTION OF PROCEDURE:  The patient was taken to the operating room and  placed in a supine position.  After adequate anesthesia was induced using  endotracheal tube, the abdomen and perineum were prepped and draped in a  normal sterile fashion.  The patient was in lithotomy position.  Using the  previous vertical midline incision, the scar was excised and the peritoneum  was entered.  There were a number of small bowel adhesions within multiple  ventral hernia sacs, which were reduced.  Other small bowel adhesions were  taken down.  The colostomy was identified and adhesions to this were also  taken down.  The rectal stump was identified and mobilized off the bladder  until the staple line could be visualized.  The colostomy was then taken  down off the fascia and reduced into the abdominal cavity.  About four to  five inches of distal descending colon was then transected.  A 2-0 Prolene  pursestring suture was then placed around the proximal colon and the 33  Ethicon dilator was placed within it.  The 33 anvil was then placed within  the proximal colon and cinched down using the pursestring  suture.  The 33 mm  EEA stapler was introduced through the anus in the rectum and anastomosis  was performed in the standard fashion.  We had two good, clean doughnuts  measuring at least a centimeter in thickness and filled the pelvis with  saline.  The rectum was insufflated with air, and there was evidence of no  leak.  The abdomen was copiously irrigated, adequate hemostasis was ensured.   The parastomal hernia was then closed in one layer using running #1 Novofil,  and the midline was closed with running #1 Novofil and interrupted figure-of-  eight Novofils near the weakened areas of herniation.  Skin was closed with  staples in the midline and loosely in the old colostomy site.  That site was  packed with saline-soaked gauze.  The patient tolerated the procedure well  and went to the PACU in good condition.  Vikki Ports, M.D.    KRH/MEDQ  D:  11/29/2001  T:  11/29/2001  Job:  [id number]

## 2010-08-04 ENCOUNTER — Emergency Department (HOSPITAL_COMMUNITY): Payer: Medicare Other

## 2010-08-04 ENCOUNTER — Inpatient Hospital Stay (HOSPITAL_COMMUNITY)
Admission: EM | Admit: 2010-08-04 | Discharge: 2010-08-06 | DRG: 389 | Disposition: A | Discharge status: Home / Self Care | Payer: Medicare Other | Attending: Surgery | Admitting: Surgery

## 2010-08-04 DIAGNOSIS — I10 Essential (primary) hypertension: Secondary | ICD-10-CM | POA: Diagnosis present

## 2010-08-04 DIAGNOSIS — R195 Other fecal abnormalities: Secondary | ICD-10-CM | POA: Diagnosis present

## 2010-08-04 DIAGNOSIS — I2782 Chronic pulmonary embolism: Secondary | ICD-10-CM | POA: Diagnosis present

## 2010-08-04 DIAGNOSIS — Z7901 Long term (current) use of anticoagulants: Secondary | ICD-10-CM

## 2010-08-04 DIAGNOSIS — N400 Enlarged prostate without lower urinary tract symptoms: Secondary | ICD-10-CM | POA: Diagnosis present

## 2010-08-04 DIAGNOSIS — Z96659 Presence of unspecified artificial knee joint: Secondary | ICD-10-CM

## 2010-08-04 DIAGNOSIS — Z85528 Personal history of other malignant neoplasm of kidney: Secondary | ICD-10-CM

## 2010-08-04 DIAGNOSIS — K56609 Unspecified intestinal obstruction, unspecified as to partial versus complete obstruction: Principal | ICD-10-CM | POA: Diagnosis present

## 2010-08-04 LAB — COMPREHENSIVE METABOLIC PANEL
BUN: 28 mg/dL — ABNORMAL HIGH (ref 6–23)
CO2: 22 mEq/L (ref 19–32)
Calcium: 10.6 mg/dL — ABNORMAL HIGH (ref 8.4–10.5)
Creatinine, Ser: 1.31 mg/dL (ref 0.4–1.5)
GFR calc Af Amer: >60 mL/min (ref >60)
GFR calc non Af Amer: 54 mL/min — ABNORMAL LOW (ref >60)
Glucose, Bld: 122 mg/dL — ABNORMAL HIGH (ref 70–99)
Total Protein: 7.7 g/dL (ref 6.0–8.3)

## 2010-08-04 LAB — CBC
HCT: 43.3 % (ref 39.0–52.0)
MCH: 30.9 pg (ref 26.0–34.0)
MCHC: 33.7 g/dL (ref 30.0–36.0)
MCV: 91.7 fL (ref 78.0–100.0)
Platelets: 252 10*3/uL (ref 150–400)
RDW: 14.5 % (ref 11.5–15.5)
WBC: 10.8 10*3/uL — ABNORMAL HIGH (ref 4.0–10.5)

## 2010-08-04 LAB — OCCULT BLOOD, POC DEVICE: Fecal Occult Bld: POSITIVE

## 2010-08-04 LAB — DIFFERENTIAL
Eosinophils Absolute: 0.2 10*3/uL (ref 0.0–0.7)
Eosinophils Relative: 2 % (ref 0–5)
Lymphs Abs: 1.4 10*3/uL (ref 0.7–4.0)
Monocytes Absolute: 0.7 10*3/uL (ref 0.1–1.0)
Monocytes Relative: 7 % (ref 3–12)

## 2010-08-04 LAB — URINALYSIS, ROUTINE W REFLEX MICROSCOPIC
Bilirubin Urine: NEGATIVE
Glucose, UA: NEGATIVE mg/dL
Ketones, ur: NEGATIVE mg/dL
Leukocytes, UA: NEGATIVE
Protein, ur: NEGATIVE mg/dL

## 2010-08-05 ENCOUNTER — Inpatient Hospital Stay (HOSPITAL_COMMUNITY): Payer: Medicare Other

## 2010-08-05 LAB — BASIC METABOLIC PANEL
Calcium: 9.9 mg/dL (ref 8.4–10.5)
GFR calc Af Amer: 58 mL/min — ABNORMAL LOW (ref >60)
GFR calc non Af Amer: 48 mL/min — ABNORMAL LOW (ref >60)
Glucose, Bld: 120 mg/dL — ABNORMAL HIGH (ref 70–99)
Potassium: 4.5 mEq/L (ref 3.5–5.1)
Sodium: 141 mEq/L (ref 135–145)

## 2010-08-05 LAB — PROTIME-INR: Prothrombin Time: 22.5 seconds — ABNORMAL HIGH (ref 11.6–15.2)

## 2010-08-06 LAB — PROTIME-INR: INR: 1.84 — ABNORMAL HIGH (ref 0.00–1.49)
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
## 2010-09-02 ENCOUNTER — Other Ambulatory Visit: Payer: Self-pay | Admitting: Dermatology

## 2010-11-23 LAB — COMPREHENSIVE METABOLIC PANEL
ALT: 52
AST: 37
Albumin: 3.2 — ABNORMAL LOW
Alkaline Phosphatase: 168 — ABNORMAL HIGH
Calcium: 9.8
GFR calc Af Amer: >60
Potassium: 4.6
Sodium: 136
Total Protein: 7.1

## 2010-11-23 LAB — DIFFERENTIAL
Basophils Relative: 0
Eosinophils Absolute: 0.1
Eosinophils Relative: 1
Lymphs Abs: 1
Monocytes Absolute: 0.7
Monocytes Relative: 9

## 2010-11-23 LAB — PROTIME-INR: INR: 2 — ABNORMAL HIGH

## 2010-11-23 LAB — CBC
Hemoglobin: 14.2
MCHC: 33.7
RDW: 16.1 — ABNORMAL HIGH

## 2010-11-25 LAB — URINE CULTURE: Culture: NO GROWTH

## 2010-11-25 LAB — COMPREHENSIVE METABOLIC PANEL
ALT: 116 — ABNORMAL HIGH
AST: 146 — ABNORMAL HIGH
AST: 173 — ABNORMAL HIGH
AST: 194 — ABNORMAL HIGH
Albumin: 2.6 — ABNORMAL LOW
Albumin: 2.6 — ABNORMAL LOW
Alkaline Phosphatase: 212 — ABNORMAL HIGH
BUN: 16
BUN: 30 — ABNORMAL HIGH
CO2: 26
CO2: 27
CO2: 29
Calcium: 9
Calcium: 9.2
Calcium: 9.5
Chloride: 104
Creatinine, Ser: 1.4
Creatinine, Ser: 1.47
Creatinine, Ser: 1.52 — ABNORMAL HIGH
GFR calc Af Amer: 55 — ABNORMAL LOW
GFR calc Af Amer: 57 — ABNORMAL LOW
GFR calc non Af Amer: 43 — ABNORMAL LOW
GFR calc non Af Amer: 47 — ABNORMAL LOW
GFR calc non Af Amer: 50 — ABNORMAL LOW
Glucose, Bld: 166 — ABNORMAL HIGH
Sodium: 137
Sodium: 138
Total Bilirubin: 6.7 — ABNORMAL HIGH
Total Protein: 6.1

## 2010-11-25 LAB — URINALYSIS, ROUTINE W REFLEX MICROSCOPIC
Nitrite: NEGATIVE
Urobilinogen, UA: 4 — ABNORMAL HIGH
pH: 6

## 2010-11-25 LAB — CULTURE, BLOOD (ROUTINE X 2): Culture: NO GROWTH

## 2010-11-25 LAB — DIFFERENTIAL
Basophils Absolute: 0
Basophils Absolute: 0
Basophils Relative: 0
Basophils Relative: 1
Eosinophils Absolute: 0
Eosinophils Absolute: 0.1
Eosinophils Relative: 2
Eosinophils Relative: 4
Lymphocytes Relative: 10 — ABNORMAL LOW
Lymphocytes Relative: 17
Lymphocytes Relative: 9 — ABNORMAL LOW
Lymphs Abs: 1
Lymphs Abs: 1
Lymphs Abs: 1.3
Monocytes Relative: 8
Neutro Abs: 10.1 — ABNORMAL HIGH
Neutro Abs: 5
Neutro Abs: 5.1
Neutrophils Relative %: 66
Neutrophils Relative %: 83 — ABNORMAL HIGH
Neutrophils Relative %: 84 — ABNORMAL HIGH

## 2010-11-25 LAB — CBC
HCT: 36.7 — ABNORMAL LOW
HCT: 43.8
Hemoglobin: 12.3 — ABNORMAL LOW
Hemoglobin: 14.8
MCHC: 33.4
MCHC: 33.7
MCHC: 34.1
MCHC: 34.2
MCV: 94.1
MCV: 94.4
MCV: 94.9
MCV: 95
Platelets: 327
Platelets: 346
Platelets: 354
Platelets: 364
RBC: 3.86 — ABNORMAL LOW
RBC: 3.91 — ABNORMAL LOW
RBC: 4.65
RDW: 16.3 — ABNORMAL HIGH
RDW: 16.5 — ABNORMAL HIGH
WBC: 10.4
WBC: 14.8 — ABNORMAL HIGH
WBC: 7.2

## 2010-11-25 LAB — CREATININE, URINE, RANDOM: Creatinine, Urine: 129

## 2010-11-25 LAB — LIPASE, BLOOD
Lipase: 117 — ABNORMAL HIGH
Lipase: 209 — ABNORMAL HIGH
Lipase: 281 — ABNORMAL HIGH

## 2010-11-25 LAB — BASIC METABOLIC PANEL
BUN: 13
BUN: 33 — ABNORMAL HIGH
CO2: 29
Calcium: 8.9
Calcium: 9.4
Creatinine, Ser: 1.19
Creatinine, Ser: 1.54 — ABNORMAL HIGH
GFR calc Af Amer: >60
GFR calc non Af Amer: 45 — ABNORMAL LOW

## 2010-11-25 LAB — AMYLASE
Amylase: 236 — ABNORMAL HIGH
Amylase: 336 — ABNORMAL HIGH

## 2010-11-25 LAB — HEPATIC FUNCTION PANEL
ALT: 258 — ABNORMAL HIGH
AST: 318 — ABNORMAL HIGH
Indirect Bilirubin: 3.8 — ABNORMAL HIGH
Total Protein: 6.4

## 2010-11-25 LAB — TYPE AND SCREEN

## 2010-11-25 LAB — PROTIME-INR
INR: 1.2
Prothrombin Time: 15.5 — ABNORMAL HIGH

## 2010-11-25 LAB — SODIUM, URINE, RANDOM: Sodium, Ur: 92

## 2011-05-01 IMAGING — CR DG CHEST 1V PORT
1 series · 1 of 1 positions shown · non-contrast
Comparison: CT chest 02/09/2010 and chest radiograph 10/27/2007

CLINICAL DATA: Chest pain shortness of breath

PORTABLE CHEST - 1 VIEW

[AP]
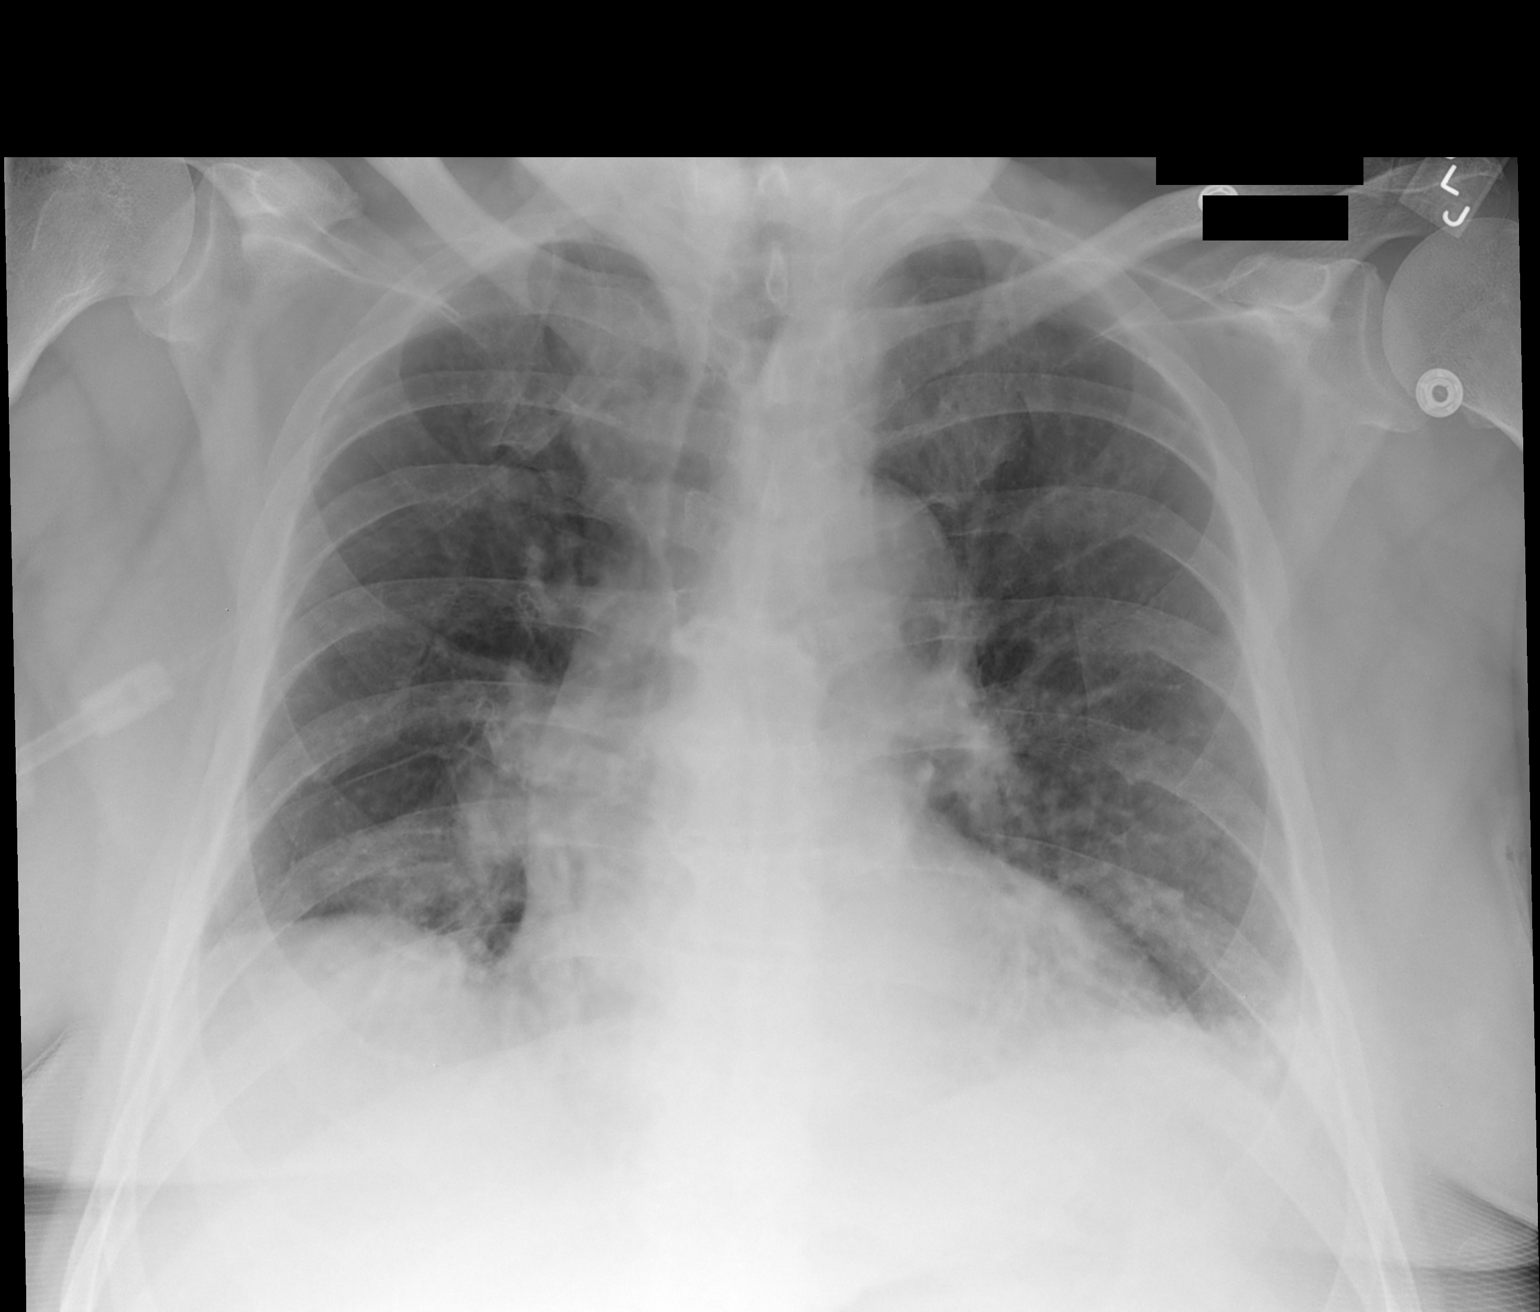

[1 of 1 positions shown; findings below may reference images not displayed]

FINDINGS: Borderline cardiomegaly.  Torturuous and stable thoracic
aorta contour.

There is pulmonary vascular congestion and with perihilar
interstitial prominence and patchy bibasilar opacities.  The left
costophrenic angle is blunted suggesting a small left pleural
effusion.
IMPRESSION: 1.  Pulmonary vascular congestion and mild pulmonary edema.
2.  Small left pleural effusion.

## 2011-05-24 DIAGNOSIS — Z9289 Personal history of other medical treatment: Secondary | ICD-10-CM | POA: Insufficient documentation

## 2011-05-24 DIAGNOSIS — R9439 Abnormal result of other cardiovascular function study: Secondary | ICD-10-CM

## 2011-05-24 HISTORY — DX: Abnormal result of other cardiovascular function study: R94.39

## 2011-06-10 ENCOUNTER — Other Ambulatory Visit: Payer: Self-pay | Admitting: Internal Medicine

## 2011-06-17 ENCOUNTER — Ambulatory Visit
Admission: RE | Admit: 2011-06-17 | Discharge: 2011-06-17 | Disposition: A | Discharge status: Home / Self Care | Payer: Medicare Other | Source: Ambulatory Visit | Attending: Internal Medicine | Admitting: Internal Medicine

## 2011-09-06 ENCOUNTER — Other Ambulatory Visit: Payer: Self-pay | Admitting: Gastroenterology

## 2011-10-24 DIAGNOSIS — Z9289 Personal history of other medical treatment: Secondary | ICD-10-CM

## 2011-10-24 HISTORY — DX: Personal history of other medical treatment: Z92.89

## 2012-05-01 ENCOUNTER — Other Ambulatory Visit: Payer: Self-pay

## 2012-10-13 ENCOUNTER — Other Ambulatory Visit (HOSPITAL_COMMUNITY): Payer: Self-pay | Admitting: Internal Medicine

## 2012-10-13 DIAGNOSIS — R911 Solitary pulmonary nodule: Secondary | ICD-10-CM

## 2012-10-20 ENCOUNTER — Encounter (HOSPITAL_COMMUNITY)
Admission: RE | Admit: 2012-10-20 | Discharge: 2012-10-20 | Disposition: A | Discharge status: Home / Self Care | Payer: Medicare Other | Source: Ambulatory Visit | Attending: Internal Medicine | Admitting: Internal Medicine

## 2012-10-20 DIAGNOSIS — Z905 Acquired absence of kidney: Secondary | ICD-10-CM | POA: Insufficient documentation

## 2012-10-20 DIAGNOSIS — Z85528 Personal history of other malignant neoplasm of kidney: Secondary | ICD-10-CM | POA: Insufficient documentation

## 2012-10-20 DIAGNOSIS — I2510 Atherosclerotic heart disease of native coronary artery without angina pectoris: Secondary | ICD-10-CM | POA: Insufficient documentation

## 2012-10-20 DIAGNOSIS — E041 Nontoxic single thyroid nodule: Secondary | ICD-10-CM | POA: Insufficient documentation

## 2012-10-20 DIAGNOSIS — N281 Cyst of kidney, acquired: Secondary | ICD-10-CM | POA: Insufficient documentation

## 2012-10-20 DIAGNOSIS — N400 Enlarged prostate without lower urinary tract symptoms: Secondary | ICD-10-CM | POA: Insufficient documentation

## 2012-10-20 DIAGNOSIS — R911 Solitary pulmonary nodule: Secondary | ICD-10-CM | POA: Insufficient documentation

## 2012-10-20 LAB — GLUCOSE, CAPILLARY: Glucose-Capillary: 115 mg/dL — ABNORMAL HIGH (ref 70–99)

## 2012-10-20 MED ORDER — FLUDEOXYGLUCOSE F - 18 (FDG) INJECTION
20.0000 | Freq: Once | INTRAVENOUS | Status: AC | PRN
  Administered 2012-10-20: 20 via INTRAVENOUS

## 2012-10-27 ENCOUNTER — Other Ambulatory Visit: Payer: Self-pay | Admitting: Internal Medicine

## 2012-10-27 DIAGNOSIS — E041 Nontoxic single thyroid nodule: Secondary | ICD-10-CM

## 2012-10-30 ENCOUNTER — Ambulatory Visit
Admission: RE | Admit: 2012-10-30 | Discharge: 2012-10-30 | Disposition: A | Discharge status: Home / Self Care | Payer: Medicare Other | Source: Ambulatory Visit | Attending: Internal Medicine | Admitting: Internal Medicine

## 2012-10-30 DIAGNOSIS — E041 Nontoxic single thyroid nodule: Secondary | ICD-10-CM

## 2012-11-18 ENCOUNTER — Other Ambulatory Visit: Payer: Self-pay | Admitting: Cardiology

## 2012-11-20 NOTE — Telephone Encounter (Signed)
Rx was sent to pharmacy electronically. 

## 2012-11-21 ENCOUNTER — Encounter (INDEPENDENT_AMBULATORY_CARE_PROVIDER_SITE_OTHER): Payer: Self-pay | Admitting: Surgery

## 2012-11-21 ENCOUNTER — Ambulatory Visit (INDEPENDENT_AMBULATORY_CARE_PROVIDER_SITE_OTHER): Payer: Medicare Other | Admitting: Surgery

## 2012-11-21 VITALS — BP 132/92 | HR 82 | Temp 96.5°F | Ht 73.0 in | Wt 228.0 lb

## 2012-11-21 DIAGNOSIS — E042 Nontoxic multinodular goiter: Secondary | ICD-10-CM | POA: Insufficient documentation

## 2012-11-21 DIAGNOSIS — D440 Neoplasm of uncertain behavior of thyroid gland: Secondary | ICD-10-CM

## 2012-11-21 DIAGNOSIS — D449 Neoplasm of uncertain behavior of unspecified endocrine gland: Secondary | ICD-10-CM

## 2012-11-21 NOTE — Patient Instructions (Signed)
Thyroid Diseases  Your thyroid is a butterfly-shaped gland in your neck. It is located just above your collarbone. It is one of your endocrine glands, which make hormones. The thyroid helps set your metabolism. Metabolism is how your body gets energy from the foods you eat.   Millions of people have thyroid diseases. Women experience thyroid problems more often than men. In fact, overactive thyroid problems (hyperthyroidism) occur in 1% of all women. If you have a thyroid disease, your body may use energy more slowly or quickly than it should.   Thyroid problems also include an immune disease where your body reacts against your thyroid gland (called thyroiditis). A different problem involves lumps and bumps (called nodules) that develop in the gland. The nodules are usually, but not always, noncancerous.  THE MOST COMMON THYROID PROBLEMS AND CAUSES ARE DISCUSSED BELOW  There are many causes for thyroid problems. Treatment depends upon the exact diagnosis and includes trying to reset your body's metabolism to a normal rate.  Hyperthyroidism  Too much thyroid hormone from an overactive thyroid gland is called hyperthyroidism. In hyperthyroidism, the body's metabolism speeds up. One of the most frequent forms of hyperthyroidism is known as Graves' disease. Graves' disease tends to run in families. Although Graves' is thought to be caused by a problem with the immune system, the exact nature of the genetic problem is unknown.  Hypothyroidism  Too little thyroid hormone from an underactive thyroid gland is called hypothyroidism. In hypothyroidism, the body's metabolism is slowed. Several things can cause this condition. Most causes affect the thyroid gland directly and hurt its ability to make enough hormone.   Rarely, there may be a pituitary gland tumor (located near the base of the brain). The tumor can block the pituitary from producing thyroid-stimulating hormone (TSH). Your body makes TSH to stimulate the thyroid  to work properly. If the pituitary does not make enough TSH, the thyroid fails to make enough hormones needed for good health.  Whether the problem is caused by thyroid conditions or by the pituitary gland, the result is that the thyroid is not making enough hormones. Hypothyroidism causes many physical and mental processes to become sluggish. The body consumes less oxygen and produces less body heat.  Thyroid Nodules  A thyroid nodule is a small swelling or lump in the thyroid gland. They are common. These nodules represent either a growth of thyroid tissue or a fluid-filled cyst. Both form a lump in the thyroid gland. Almost half of all people will have tiny thyroid nodules at some point in their lives. Typically, these are not noticeable until they become large and affect normal thyroid size. Larger nodules that are greater than a half inch across (about 1 centimeter) occur in about 5 percent of people.  Although most nodules are not cancerous, people who have them should seek medical care to rule out cancer. Also, some thyroid nodules may produce too much thyroid hormone or become too large. Large nodules or a large gland can interfere with breathing or swallowing or may cause neck discomfort.  Other problems  Other thyroid problems include cancer and thyroiditis. Thyroiditis is a malfunction of the body's immune system. Normally, the immune system works to defend the body against infection and other problems. When the immune system is not working properly, it may mistakenly attack normal cells, tissues, and organs. Examples of autoimmune diseases are Hashimoto's thyroiditis (which causes low thyroid function) and Graves' disease (which causes excess thyroid function).  SYMPTOMS   Symptoms   vary greatly depending upon the exact type of problem with the thyroid.  Hyperthyroidism-is when your thyroid is too active and makes more thyroid hormone than your body needs. The most common cause is Graves' Disease. Too  much thyroid hormone can cause some or all of the following symptoms:  · Anxiety.  · Irritability.  · Difficulty sleeping.  · Fatigue.  · A rapid or irregular heartbeat.  · A fine tremor of your hands or fingers.  · An increase in perspiration.  · Sensitivity to heat.  · Weight loss, despite normal food intake.  · Brittle hair.  · Enlargement of your thyroid gland (goiter).  · Light menstrual periods.  · Frequent bowel movements.  Graves' disease can specifically cause eye and skin problems. The skin problems involve reddening and swelling of the skin, often on your shins and on the top of your feet. Eye problems can include the following:  · Excess tearing and sensation of grit or sand in either or both eyes.  · Reddened or inflamed eyes.  · Widening of the space between your eyelids.  · Swelling of the lids and tissues around the eyes.  · Light sensitivity.  · Ulcers on the cornea.  · Double vision.  · Limited eye movements.  · Blurred or reduced vision.  Hypothyroidism- is when your thyroid gland is not active enough. This is more common than hyperthyroidism. Symptoms can vary a lot depending of the severity of the hormone deficiency. Symptoms may develop over a long period of time and can include several of the following:  · Fatigue.  · Sluggishness.  · Increased sensitivity to cold.  · Constipation.  · Pale, dry skin.  · A puffy face.  · Hoarse voice.  · High blood cholesterol level.  · Unexplained weight gain.  · Muscle aches, tenderness and stiffness.  · Pain, stiffness or swelling in your joints.  · Muscle weakness.  · Heavier than normal menstrual periods.  · Brittle fingernails and hair.  · Depression.  Thyroid Nodules - most do not cause signs or symptoms. Occasionally, some may become so large that you can feel or even see the swelling at the base of your neck. You may realize a lump or swelling is there when you are shaving or putting on makeup. Men might become aware of a nodule when shirt collars  suddenly feel too tight.  Some nodules produce too much thyroid hormone. This can produce the same symptoms as hyperthyroidism (see above).  Thyroid nodules are seldom cancerous. However, a nodule is more likely to be malignant (cancerous) if it:  · Grows quickly or feels hard.  · Causes you to become hoarse or to have trouble swallowing or breathing.  · Causes enlarged lymph nodes under your jaw or in your neck.  DIAGNOSIS   Because there are so many possible thyroid conditions, your caregiver may ask for a number of tests. They will do this in order to narrow down the exact diagnosis. These tests can include:  · Blood and antibody tests.  · Special thyroid scans using small, safe amounts of radioactive iodine.  · Ultrasound of the thyroid gland (particularly if there is a nodule or lump).  · Biopsy. This is usually done with a special needle. A needle biopsy is a procedure to obtain a sample of cells from the thyroid. The tissue will be tested in a lab and examined under a microscope.  TREATMENT   Treatment depends on the exact diagnosis.    Hyperthyroidism  · Beta-blockers help relieve many of the symptoms.  · Anti-thyroid medications prevent the thyroid from making excess hormones.  · Radioactive iodine treatment can destroy overactive thyroid cells. The iodine can permanently decrease the amount of hormone produced.  · Surgery to remove the thyroid gland.  · Treatments for eye problems that come from Graves' disease also include medications and special eye surgery, if felt to be appropriate.  Hypothyroidism  Thyroid replacement with levothyroxine is the mainstay of treatment. Treatment with thyroid replacement is usually lifelong and will require monitoring and adjustment from time to time.  Thyroid Nodules  · Watchful waiting. If a small nodule causes no symptoms or signs of cancer on biopsy, then no treatment may be chosen at first. Re-exam and re-checking blood tests would be the recommended  follow-up.  · Anti-thyroid medications or radioactive iodine treatment may be recommended if the nodules produce too much thyroid hormone (see Treatment for Hyperthyroidism above).  · Alcohol ablation. Injections of small amounts of ethyl alcohol (ethanol) can cause a non-cancerous nodule to shrink in size.  · Surgery (see Treatment for Hyperthyroidism above).  HOME CARE INSTRUCTIONS   · Take medications as instructed.  · Follow through on recommended testing.  SEEK MEDICAL CARE IF:   · You feel that you are developing symptoms of Hyperthyroidism or Hypothyroidism as described above.  · You develop a new lump/nodule in the neck/thyroid area that you had not noticed before.  · You feel that you are having side effects from medicines prescribed.  · You develop trouble breathing or swallowing.  SEEK IMMEDIATE MEDICAL CARE IF:   · You develop a fever of 102° F (38.9° C) or higher.  · You develop severe sweating.  · You develop palpitations and/or rapid heart beat.  · You develop shortness of breath.  · You develop nausea and vomiting.  · You develop extreme shakiness.  · You develop agitation.  · You develop lightheadedness or have a fainting episode.  Document Released: 12/06/2006 Document Revised: 05/03/2011 Document Reviewed: 12/06/2006  [employer]® Patient Information ©2014 [employer].

## 2012-11-21 NOTE — Progress Notes (Signed)
General Surgery [facility]  Chief Complaint  Patient presents with  . New Evaluation    evaluate left thyroid nodule - referral by Dr. Creola Corn, [facility]    HISTORY: Patient is a 78 year old male known to my surgical practice from his prior ventral incisional hernia repair in 2012. Patient had chest discomfort and underwent a CT scan of the chest. This was performed in August 2014. Incidental finding was noted of a thyroid nodule. Patient also underwent a PET scan which identified increased uptake in the left thyroid lobe. A thyroid ultrasound was performed on 10/30/2012. This shows a minimally enlarged thyroid gland containing multiple thyroid nodules. The dominant nodule in the left measures 2.3 x 1.1 x 1.2 cm. The dominant nodule in the right measures 1.5 x 0.9 x 1.3 cm. Biopsy of the left thyroid nodule was recommended. Patient is now referred by his primary care physician for further recommendations for management.  Patient states that he had bilateral thyroid nodule biopsies performed in 2011. Those records are not available to me today. We will obtain those records from pathology for review.  Patient has never been on thyroid medication. He has had no prior head or neck surgery. There is no family history of thyroid disease and specifically no history of thyroid cancer. There is no family history of other endocrine neoplasm.  Past Medical History  Diagnosis Date  . Hypertension   . Hernia   . Arthritis   . Blood transfusion without reported diagnosis   . Clotting disorder   . Cancer     renal cell  . Hyperlipidemia     Current Outpatient Prescriptions  Medication Sig Dispense Refill  . allopurinol (ZYLOPRIM) 300 MG tablet       . cefdinir (OMNICEF) 300 MG capsule       . fenofibrate 160 MG tablet       . finasteride (PROSCAR) 5 MG tablet       . losartan (COZAAR) 50 MG tablet       . metoprolol tartrate (LOPRESSOR) 25 MG tablet Take  1 tablet (25 mg total) by mouth 2 (two) times daily.  60 tablet  2  . tamsulosin (FLOMAX) 0.4 MG CAPS capsule        No current facility-administered medications for this visit.    No Known Allergies  History reviewed. No pertinent family history.  History   Social History  . Marital Status: Married    Spouse Name: N/A    Number of Children: N/A  . Years of Education: N/A   Social History Main Topics  . Smoking status: Unknown If Ever Smoked  . Smokeless tobacco: None  . Alcohol Use: No  . Drug Use: No  . Sexual Activity: None   Other Topics Concern  . None   Social History Narrative  . None    REVIEW OF SYSTEMS - PERTINENT POSITIVES ONLY: Denies tremor. Denies palpitation. Denies compressive symptoms.  EXAM: Filed Vitals:   11/21/12 1629  BP: 132/92  Pulse: 82  Temp: 96.5 F (35.8 C)    HEENT: normocephalic; pupils equal and reactive; sclerae clear; dentition good; mucous membranes moist NECK:  Left thyroid lobe with palpable 2.5 cm nodule, relatively flat and shape, slightly firm, mobile with swallowing, mildly tender; in the left supraclavicular fossa is a soft tissue mass measuring approximately 2 cm just behind the mid clavicle which is relatively soft, mobile, and mildly tender; right thyroid lobe is slightly nodular without discrete  or dominant mass; right supraclavicular fossa without palpable abnormality; symmetric on extension; no palpable anterior or posterior cervical lymphadenopathy; no supraclavicular masses; no tenderness CHEST: clear to auscultation bilaterally without rales, rhonchi, or wheezes CARDIAC: regular rate and rhythm without significant murmur; peripheral pulses are full ABDOMEN: soft without distension; bowel sounds present; no mass; no hepatosplenomegaly; no recurrent hernia; surgical incisions are well-healed EXT:  non-tender without edema; no deformity NEURO: no gross focal deficits; no sign of tremor   LABORATORY RESULTS: See [facility] (CHL-Epic) for most recent results  RADIOLOGY RESULTS: See [facility] (CHL-Epic) for most recent results  IMPRESSION: Bilateral thyroid nodules with dominant left lobe nodule, 2.3 cm  PLAN: I reviewed the above studies at length with the patient. I have recommended repeat biopsy of the dominant left thyroid nodule. My experience with PET positive thyroid nodules is that the malignancy rate is approximately 30%. We will order and ultrasound-guided fine-needle aspiration biopsy in the near future. I will contact the patient with those results.  We will obtain his previous thyroid biopsies for review.  Patient and I discussed possible outcomes from his biopsy. Certainly in the event of malignancy or significant atypia, I would recommend total thyroidectomy given the fact that he has bilateral nodules. If the aspiration is completely benign, then I would recommend a short-term followup ultrasound in 6 months.  We will await the results of his ultrasound guided biopsy.  Velora Heckler, MD, FACS General & Endocrine Surgery [facility]  Primary Care Physician: Gwen Pounds, MD

## 2012-11-22 ENCOUNTER — Telehealth (INDEPENDENT_AMBULATORY_CARE_PROVIDER_SITE_OTHER): Payer: Self-pay | Admitting: Surgery

## 2012-11-22 ENCOUNTER — Other Ambulatory Visit (INDEPENDENT_AMBULATORY_CARE_PROVIDER_SITE_OTHER): Payer: Self-pay

## 2012-11-22 DIAGNOSIS — E041 Nontoxic single thyroid nodule: Secondary | ICD-10-CM

## 2012-11-22 NOTE — Telephone Encounter (Signed)
Review of medical records reveals the results of fine-needle aspiration biopsy of both right and left thyroid nodules performed in August 2011. Cytopathology was benign for both the right and left thyroid nodules being consistent with nonneoplastic goiter.  Velora Heckler, MD, [facility] Office: [phone]

## 2012-11-23 ENCOUNTER — Other Ambulatory Visit (HOSPITAL_COMMUNITY)
Admission: RE | Admit: 2012-11-23 | Discharge: 2012-11-23 | Disposition: A | Discharge status: Home / Self Care | Payer: Medicare Other | Source: Ambulatory Visit | Attending: Interventional Radiology | Admitting: Interventional Radiology

## 2012-11-23 ENCOUNTER — Ambulatory Visit
Admission: RE | Admit: 2012-11-23 | Discharge: 2012-11-23 | Disposition: A | Discharge status: Home / Self Care | Payer: Medicare Other | Source: Ambulatory Visit | Attending: Surgery | Admitting: Surgery

## 2012-11-23 DIAGNOSIS — E041 Nontoxic single thyroid nodule: Secondary | ICD-10-CM

## 2012-11-27 ENCOUNTER — Encounter: Payer: Self-pay | Admitting: Cardiology

## 2012-11-28 ENCOUNTER — Ambulatory Visit (INDEPENDENT_AMBULATORY_CARE_PROVIDER_SITE_OTHER): Payer: Medicare Other | Admitting: Cardiology

## 2012-11-28 ENCOUNTER — Encounter: Payer: Self-pay | Admitting: Cardiology

## 2012-11-28 VITALS — BP 122/80 | HR 57 | Ht 73.0 in | Wt 228.4 lb

## 2012-11-28 DIAGNOSIS — E669 Obesity, unspecified: Secondary | ICD-10-CM

## 2012-11-28 DIAGNOSIS — D449 Neoplasm of uncertain behavior of unspecified endocrine gland: Secondary | ICD-10-CM

## 2012-11-28 DIAGNOSIS — Z9289 Personal history of other medical treatment: Secondary | ICD-10-CM

## 2012-11-28 DIAGNOSIS — E785 Hyperlipidemia, unspecified: Secondary | ICD-10-CM

## 2012-11-28 DIAGNOSIS — D440 Neoplasm of uncertain behavior of thyroid gland: Secondary | ICD-10-CM

## 2012-11-28 DIAGNOSIS — I10 Essential (primary) hypertension: Secondary | ICD-10-CM

## 2012-11-28 DIAGNOSIS — Z9189 Other specified personal risk factors, not elsewhere classified: Secondary | ICD-10-CM

## 2012-11-28 DIAGNOSIS — R9439 Abnormal result of other cardiovascular function study: Secondary | ICD-10-CM

## 2012-11-28 NOTE — Patient Instructions (Signed)
Things seem stable from a heart perspective.   BP & Heart Rate look good.  I wish you the best of luck with your pathology results & future endeavors involving your Thyroid studies.    I would like to see you when all is said & done just to know that you are well.  Otherwise, I will schedule a return visit for 1 yr.  Marykay Lex, MD

## 2012-11-30 ENCOUNTER — Telehealth (INDEPENDENT_AMBULATORY_CARE_PROVIDER_SITE_OTHER): Payer: Self-pay | Admitting: General Surgery

## 2012-11-30 NOTE — Telephone Encounter (Signed)
Pt called to get results of needle bx done on thyroid.  Spoke with Dr. Gerrit Friends and there was no malignancy.  He will be contacted by Arline Asp for follow up US and office visit in 6 months.  He can see his PCP for any further symptoms of tiredness or malaise.  LM on unidentified VM for pt to call back for message.

## 2012-11-30 NOTE — Telephone Encounter (Signed)
Pt given message from Dr. Gerrit Friends and understands all.

## 2012-12-01 ENCOUNTER — Encounter: Payer: Self-pay | Admitting: Cardiology

## 2012-12-01 DIAGNOSIS — E669 Obesity, unspecified: Secondary | ICD-10-CM | POA: Insufficient documentation

## 2012-12-01 DIAGNOSIS — I10 Essential (primary) hypertension: Secondary | ICD-10-CM | POA: Insufficient documentation

## 2012-12-01 DIAGNOSIS — E785 Hyperlipidemia, unspecified: Secondary | ICD-10-CM | POA: Insufficient documentation

## 2012-12-01 NOTE — Assessment & Plan Note (Signed)
Well controlled with current medications.

## 2012-12-01 NOTE — Progress Notes (Signed)
PATIENT: [name] MRN: [id number]  DOB: 10-14-1934   DOV:12/01/2012 PCP: Gwen Pounds, MD  Clinic Note: Chief Complaint  Patient presents with  . Annual Exam    chest discomfort, being worked up for thyriod cancer, no sob ,no edema    HPI: [name] is a 78 y.o. male with a PMH below who presents today for almost and will followup. He was initially referred to me to evaluate chest discomfort in 2013. He was evaluated with a LexiScan Myoview that showed a small sized moderate intensity defect. The wall of the uterus scar. There was no evidence to suggest ischemia. To better evaluate wall motion for possible infarction, echocardiogram performed in September 13 that did not demonstrate any significant wall motion abnormality and inferoapical region..  Interval History: He returns today doing relatively well from a cardiac standpoint. He is currently undergoing evaluation for thyroid nodule. He is waiting on the results of the biopsy. He denies any further chest discomfort episodes, but does have intermittent "indigestion symptom"  in his epigastric area . Usually this symptom occurs with rest and on exertion. He has had maybe 3 episodes since his last visit.  The remainder of Cardiovascular ROS: no chest pain or dyspnea on exertion negative for - edema, irregular heartbeat, loss of consciousness, murmur, orthopnea, palpitations, paroxysmal nocturnal dyspnea, rapid heart rate or shortness of breath: Additional cardiac review of systems: Lightheadedness - no, dizziness - no, syncope/near-syncope - no; TIA/amaurosis fugax - no Melena - no, hematochezia no; hematuria - no; nosebleeds - no; claudication - no  Past Medical History  Diagnosis Date  . Hernia     s/p repair  . Arthritis   . Blood transfusion without reported diagnosis   . PE (pulmonary thromboembolism) 03/2010    Post Op  . Renal cell carcinoma of right kidney 1996    s/p R Nephrectomy R Lung Lowe Lobectomy  .  Hyperlipidemia   . Hypertension   . Abnormal nuclear stress test 05/2011    small sized, moderate intensity inferoapical perfusion defect, mild reversibilty; Low RISK; ? prior infarct vs. artifact; no WMA associated  . H/O echocardiogram 10/2011    Normal Wall motion, mild Conc LVH; Normal EF; mild Aortic sclerosis    Prior Cardiac Evaluation and Past Surgical History: Past Surgical History  Procedure Laterality Date  . Nephrectomy radical Right 1996    for RCCA  . Lung lobectomy Right     Lower  . Bowel resection  2003 - 2012    for Bowel perforation --> partial colectomy - colostomy & colostomy takedown  . Cholecystectomy    . Hernia repair    . Total knee arthroplasty        Allergies  Allergen Reactions  . Vicodin [Hydrocodone-Acetaminophen] Nausea Only    Current Outpatient Prescriptions  Medication Sig Dispense Refill  . allopurinol (ZYLOPRIM) 300 MG tablet Take 300 mg by mouth daily.       . fenofibrate 160 MG tablet Take 160 mg by mouth daily.       . finasteride (PROSCAR) 5 MG tablet Take 5 mg by mouth daily.       Marland Kitchen losartan (COZAAR) 50 MG tablet Take 50 mg by mouth daily.       . metoprolol tartrate (LOPRESSOR) 25 MG tablet Take 1/2 tablet by mouth twice aday      . Multiple Vitamin (MULTIVITAMIN) tablet Take 1 tablet by mouth daily.      . tamsulosin (FLOMAX) 0.4 MG  CAPS capsule Take 0.4 mg by mouth 2 (two) times daily.        No current facility-administered medications for this visit.    History   Social History Narrative   He is a married father of 3, grandfather of 4. Enjoys walking his dog with his wife on a daily basis. In doing this he has not really noticed any exertional symptoms. He quit smoking 20 years ago   and only drinks an occasional alcoholic beverage. 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     ROS: A comprehensive Review of Systems - Negative except  arthritis related  musculoskeletall discommfoortt..  PHYSICAL EXAM BP 122/80  Pulse 57  Ht 6\' 1"  (1.854 m)  Wt 228 lb 6.4 oz (103.602 kg)  BMI 30.14 kg/m2 General appearance: alert, cooperative, appears stated age, no distress and borderline obese Neck: no adenopathy, no carotid bruit and no JVD Lungs: clear to auscultation bilaterally, normal percussion bilaterally and non-labored Heart: regular rate and rhythm, S1, S2 normal, no murmur, click, rub or gallop ; a nondisplaced MI; Abdomen: soft, non-tender; bowel sounds normal; no masses,  no organomegaly;  mild truncal obesity Extremities: extremities normal, atraumatic, no cyanosis, and edema trace, with mild varicosities/spider veins. Pulses: 2+ and symmetric;  Neurologic: Mental status: Alert, oriented, thought content appropriate Cranial nerves: normal (II-XII grossly intact)  WUJ:[id number] today: Yes Rate: 57 , Rhythm:  Sinus bradycardia, RBBB. No significant change.  Recent Labs: No recent labs  ASSESSMENT / PLAN: Abnormal nuclear stress test In the absence of significant anginal symptoms, it cannot convinced that the stress test findings are consistent with scar, because the echocardiogram was not suggestive of any wall motion abnormalities that would go along with the scar. He doesn't recall any prolonged episodes of chest discomfort besides what was noted last year. Otherwise he really is doing relatively well any active symptoms.   With no ischemia on the stress test, there is no reason to do any invasive evaluation as his EF by echo did not confirm any true wall motion changes. He is currently on beta blocker and ARB as well as aspirin. He is not on statin due to intolerance.  History of nuclear stress test See above. I would be reluctant to repeat stress test as part of preoperative risk assessment.  Hyperlipidemia On fenofibrate, followed by PCP. Has had intolerance to statins in the past  Hypertension Well-controlled with current  medications  Obesity (BMI 30-39.9)  Mild obesity, relatively stable. He is working on dietary modifications, and is somewhat active.  Neoplasm of uncertain behavior of thyroid gland With no active cardiac symptoms of angina or heart failure. This should be no need to repeat any cardiovascular evaluation as part preoperative risk assessment.    Orders Placed This Encounter  Procedures  . EKG 12-Lead   Meds ordered this encounter  Medications  . Multiple Vitamin (MULTIVITAMIN) tablet    Sig: Take 1 tablet by mouth daily.  . metoprolol tartrate (LOPRESSOR) 25 MG tablet    Sig: Take 1/2 tablet by mouth twice aday    Followup: One year  Cynthie Garmon W. Herbie Baltimore, M.D., M.S. [facility] [address] [place], [place]  [address]  [phone] Pager # [phone]

## 2012-12-01 NOTE — Assessment & Plan Note (Addendum)
In the absence of significant anginal symptoms, it cannot convinced that the stress test findings are consistent with scar, because the echocardiogram was not suggestive of any wall motion abnormalities that would go along with the scar. He doesn't recall any prolonged episodes of chest discomfort besides what was noted last year. Otherwise he really is doing relatively well any active symptoms.   With no ischemia on the stress test, there is no reason to do any invasive evaluation as his EF by echo did not confirm any true wall motion changes. He is currently on beta blocker and ARB as well as aspirin. He is not on statin due to intolerance.

## 2012-12-01 NOTE — Assessment & Plan Note (Signed)
On fenofibrate, followed by PCP. Has had intolerance to statins in the past

## 2012-12-01 NOTE — Assessment & Plan Note (Addendum)
Mild obesity, relatively stable. He is working on dietary modifications, and is somewhat active.

## 2012-12-01 NOTE — Assessment & Plan Note (Signed)
With no active cardiac symptoms of angina or heart failure. This should be no need to repeat any cardiovascular evaluation as part preoperative risk assessment.

## 2012-12-01 NOTE — Assessment & Plan Note (Signed)
See above. I would be reluctant to repeat stress test as part of preoperative risk assessment.

## 2012-12-11 ENCOUNTER — Encounter: Payer: Self-pay | Admitting: Cardiology

## 2012-12-11 ENCOUNTER — Encounter (INDEPENDENT_AMBULATORY_CARE_PROVIDER_SITE_OTHER): Payer: Self-pay

## 2013-01-03 ENCOUNTER — Encounter (INDEPENDENT_AMBULATORY_CARE_PROVIDER_SITE_OTHER): Payer: Self-pay

## 2013-05-29 ENCOUNTER — Encounter (INDEPENDENT_AMBULATORY_CARE_PROVIDER_SITE_OTHER): Payer: Self-pay | Admitting: Surgery

## 2013-06-01 ENCOUNTER — Telehealth (INDEPENDENT_AMBULATORY_CARE_PROVIDER_SITE_OTHER): Payer: Self-pay | Admitting: *Deleted

## 2013-06-01 NOTE — Telephone Encounter (Signed)
Pt called today inquiring about a Ultra Sound and a return appt.  I don't see any orders for this,  Please advise!  Anderson Malta

## 2013-06-04 NOTE — Telephone Encounter (Signed)
Please schedule follow up thyroid ultrasound.  Please schedule TSH level.  Please arrange follow up appt with me in the office.  Earnstine Regal, MD, [facility] Office: [phone]

## 2013-06-06 ENCOUNTER — Other Ambulatory Visit (INDEPENDENT_AMBULATORY_CARE_PROVIDER_SITE_OTHER): Payer: Self-pay

## 2013-06-06 DIAGNOSIS — E042 Nontoxic multinodular goiter: Secondary | ICD-10-CM

## 2013-06-06 NOTE — Telephone Encounter (Signed)
Pt returned call. I gave him the message from below by Dois Davenport. The pt said he might get his TSH done with his yearly labs at Dr Keane Police office instead of [facility]. I advised pt that would be fine as long as we get a copy of the TSH. The pt understands.

## 2013-06-06 NOTE — Telephone Encounter (Signed)
Per Dr Gala Lewandowsky request order for thyroid u/s and tsh placed in epic. I have scheduled u/s at [place] for 06-20-13 arrive at 11:15am. Lab slip and appt info for this u/s mailed to pt. Pt needs to get labs same day as u/s. Pt can call [phone] if date of u/s needs to be changed.

## 2013-06-18 ENCOUNTER — Telehealth: Payer: Self-pay | Admitting: Cardiology

## 2013-06-18 NOTE — Telephone Encounter (Signed)
Great thanks.  Robert Man, MD

## 2013-06-18 NOTE — Telephone Encounter (Signed)
Would like for Dr.Harding to know that there is no Cancer noted and its a random clump of cells that have settle in the thyroid , which are affecting the operation of the performance of the thyroid .Marland Kitchen    Thanks

## 2013-06-20 ENCOUNTER — Ambulatory Visit
Admission: RE | Admit: 2013-06-20 | Discharge: 2013-06-20 | Disposition: A | Discharge status: Home / Self Care | Payer: Medicare Other | Source: Ambulatory Visit | Attending: Surgery | Admitting: Surgery

## 2013-06-20 DIAGNOSIS — E042 Nontoxic multinodular goiter: Secondary | ICD-10-CM

## 2013-06-20 LAB — TSH: TSH: 1.439 u[IU]/mL (ref 0.350–4.500)

## 2013-06-22 ENCOUNTER — Telehealth (INDEPENDENT_AMBULATORY_CARE_PROVIDER_SITE_OTHER): Payer: Self-pay

## 2013-06-22 NOTE — Telephone Encounter (Signed)
Received message:   Call Documentation     Dois Davenport, LPN at 5/0/5697 9:48 PM     Status: Signed        LMOM for pt to call back. We have received the u/s and labs in epic for pt thyroid f/u. I have sent them to Dr Harlow Asa for review.            I will see patient in the office.  We will review his results at that time.  Earnstine Regal, MD, [facility] Office: [phone]

## 2013-06-22 NOTE — Telephone Encounter (Signed)
Pt returned call. Pt advised of test results and that Dr Harlow Asa will review next week. Pt advised Dr Harlow Asa will determine f/u.

## 2013-06-22 NOTE — Telephone Encounter (Signed)
LMOM for pt to call back. We have received the u/s and labs in epic for pt thyroid f/u. I have sent them to Dr Harlow Asa for review.

## 2013-06-28 ENCOUNTER — Telehealth (INDEPENDENT_AMBULATORY_CARE_PROVIDER_SITE_OTHER): Payer: Self-pay

## 2013-06-28 NOTE — Telephone Encounter (Signed)
LMOM for pt to call. Dr Harlow Asa has reviewed u/s and labs. Per Dr Harlow Asa testing stable and pt needs to follow up in Oct at the one year mark for physical  exam.

## 2013-11-30 ENCOUNTER — Ambulatory Visit (INDEPENDENT_AMBULATORY_CARE_PROVIDER_SITE_OTHER): Payer: Medicare Other | Admitting: Cardiology

## 2013-11-30 VITALS — BP 110/78 | HR 83 | Ht 73.0 in | Wt 220.5 lb

## 2013-11-30 DIAGNOSIS — E669 Obesity, unspecified: Secondary | ICD-10-CM

## 2013-11-30 DIAGNOSIS — I10 Essential (primary) hypertension: Secondary | ICD-10-CM

## 2013-11-30 DIAGNOSIS — R9439 Abnormal result of other cardiovascular function study: Secondary | ICD-10-CM

## 2013-11-30 DIAGNOSIS — R931 Abnormal findings on diagnostic imaging of heart and coronary circulation: Secondary | ICD-10-CM

## 2013-11-30 DIAGNOSIS — E785 Hyperlipidemia, unspecified: Secondary | ICD-10-CM

## 2013-11-30 NOTE — Patient Instructions (Signed)
No change in current medications  Your physician wants you to follow-up in 12 months Dr harding.  You will receive a reminder letter in the mail two months in advance. If you don't receive a letter, please call our office to schedule the follow-up appointment.

## 2013-12-01 ENCOUNTER — Encounter: Payer: Self-pay | Admitting: Cardiology

## 2013-12-01 NOTE — Progress Notes (Signed)
PCP: Precious Reel, MD  Clinic Note: Chief Complaint  Patient presents with  . Follow-up    1 year follow-up, pt c/o burning in chest like indigestion     HPI: [name] is a 79 y.o. male with a PMH below who presents today for 1 year follow-up.  I first saw him in the summer of 2013 to evaluate chest discomfort -- Myoview showing small-sized moderate intensity perfusion defect - that was suspicious for an inferoapical infarct with no evidence of ischemia.  The EF was perserved & confirmed on Echo with no notable wall motion defect.  With a LOW RISK finding, we decided to continue with medical management & he really has been asymptomatic from a cardiac standpoint.   Past Medical History  Diagnosis Date  . Hernia     s/p repair  . Arthritis   . Blood transfusion without reported diagnosis   . PE (pulmonary thromboembolism) 03/2010    Post Op  . Renal cell carcinoma of right kidney 1996    s/p R Nephrectomy R Lung Lowe Lobectomy  . Hyperlipidemia   . Hypertension   . Abnormal nuclear stress test 05/2011    small sized, moderate intensity inferoapical perfusion defect, mild reversibilty; Low RISK; ? prior infarct vs. artifact; no WMA associated  . H/O echocardiogram 10/2011    Normal Wall motion, mild Conc LVH; Normal EF; mild Aortic sclerosis    Prior Cardiac Evaluation and Past Surgical History: Past Surgical History  Procedure Laterality Date  . Nephrectomy radical Right 1996    for RCCA  . Lung lobectomy Right     Lower  . Bowel resection  2003 - 2012    for Bowel perforation --> partial colectomy - colostomy & colostomy takedown  . Cholecystectomy    . Hernia repair    . Total knee arthroplasty    . Stress lexiscan myocardial perfusion      with wall motio, left ventricular ejection  . Doppler echocardiography      mild concentric left. Normal    Interval History: [name] presents today doing relatively well overall from a cardiac standpoint. He notes 3-4 episodes  over past year or so some discomfort in his chest most of time he can be sitting or walking and not made worse by either activity. He says it feels a lot like indigestion about 2 times he started to worry about it. He took a lot of TUMS that didn't get much help. Otherwise he is not having these episodes at all with rest or exertion his sons is 4 episodes. He has not had any recently. He denies any other chest discomfort with rest or exertion, no dyspnea at rest or exertion. He remains relatively active with walking. He denies any PND, orthopnea or edema.  No palpitations, lightheadedness, dizziness, weakness or syncope/near syncope. No claudication.  ROS: A comprehensive was performed. Review of Systems  HENT: Negative for congestion and nosebleeds.   Eyes: Negative for blurred vision.  Respiratory: Negative for cough, hemoptysis and wheezing.   Cardiovascular: Negative.        Per HPI  Gastrointestinal: Positive for heartburn. Negative for blood in stool and melena.  Genitourinary: Negative for hematuria.  Musculoskeletal: Positive for joint pain. Negative for myalgias.  Neurological: Negative for dizziness, sensory change, speech change, focal weakness, seizures and loss of consciousness.  Endo/Heme/Allergies: Does not bruise/bleed easily.  Psychiatric/Behavioral: Negative for depression. The patient is not nervous/anxious.   All other systems reviewed and are negative.  Current Outpatient Prescriptions on File Prior to Visit  Medication Sig Dispense Refill  . allopurinol (ZYLOPRIM) 300 MG tablet Take 300 mg by mouth daily.       . fenofibrate 160 MG tablet Take 160 mg by mouth daily.       . finasteride (PROSCAR) 5 MG tablet Take 5 mg by mouth daily.       Marland Kitchen losartan (COZAAR) 50 MG tablet Take 50 mg by mouth daily.       . Multiple Vitamin (MULTIVITAMIN) tablet Take 1 tablet by mouth daily.      . tamsulosin (FLOMAX) 0.4 MG CAPS capsule Take 0.4 mg by mouth 2 (two) times daily.          No current facility-administered medications on file prior to visit.    ALLERGIES REVIEWED IN EPIC -- No change SOCIAL AND FAMILY HISTORY REVIEWED IN EPIC -- No change  Wt Readings from Last 3 Encounters:  11/30/13 220 lb 8 oz (100.018 kg)  11/28/12 228 lb 6.4 oz (103.602 kg)  11/21/12 228 lb (103.42 kg)    PHYSICAL EXAM BP 110/78  Pulse 83  Ht 6\' 1"  (1.854 m)  Wt 220 lb 8 oz (100.018 kg)  BMI 29.10 kg/m2 General appearance: alert, cooperative, appears stated age, no distress and borderline obese  Neck: no adenopathy, no carotid bruit and no JVD  Lungs: clear to auscultation bilaterally, normal percussion bilaterally and non-labored  Heart: regular rate and rhythm, S1, S2 normal, no murmur, click, rub or gallop ; a nondisplaced MI;  Abdomen: soft, non-tender; bowel sounds normal; no masses, no organomegaly; mild truncal obesity  Extremities: extremities normal, atraumatic, no cyanosis, and edema trace, with mild varicosities/spider veins.  Pulses: 2+ and symmetric;  Neurologic: Mental status: Alert, oriented, thought content appropriate  Cranial nerves: normal (II-XII grossly intact)   Adult ECG Report  Rate: 83 ;  Rhythm: normal sinus rhythm, RBBB (~LAD vs. LAFB)  Narrative Interpretation: stable   Recent Labs:  None available.   ASSESSMENT / PLAN: Abnormal nuclear stress test With the absence of ischemia on his stress test, the presence of possible apical infarct could easily be artifact as well. He doesn't seem to be having any significant cardiac symptoms, therefore simply continuing and modification of his cardiac risk factors is prudent. I don't think any additional investigation needs to be done in the absence of symptoms. The absence of apical wall motion abnormality on echocardiogram is reassuring.  Essential hypertension Excellent blood pressure control and her medications. He is only on low-dose ARB.  Hyperlipidemia with target LDL less than 100; with elevated  triglycerides He is on fenofibrate, with history of statin intolerance in the past. As the primary driving factors hypertriglyceridemia, fenofibrate is probably a good option.   Monitored by PCP.  Obesity (BMI 30-39.9) Continue to increase exercise level and modify dietary intake.    Orders Placed This Encounter  Procedures  . EKG 12-Lead   No orders of the defined types were placed in this encounter.     Followup: One year   Leonie Man, M.D., M.S. Interventional Cardiologist   Pager # [phone]

## 2013-12-01 NOTE — Assessment & Plan Note (Signed)
Continue to increase exercise level and modify dietary intake.

## 2013-12-01 NOTE — Assessment & Plan Note (Signed)
With the absence of ischemia on his stress test, the presence of possible apical infarct could easily be artifact as well. He doesn't seem to be having any significant cardiac symptoms, therefore simply continuing and modification of his cardiac risk factors is prudent. I don't think any additional investigation needs to be done in the absence of symptoms. The absence of apical wall motion abnormality on echocardiogram is reassuring.

## 2013-12-01 NOTE — Assessment & Plan Note (Signed)
Excellent blood pressure control and her medications. He is only on low-dose ARB.

## 2013-12-01 NOTE — Assessment & Plan Note (Signed)
He is on fenofibrate, with history of statin intolerance in the past. As the primary driving factors hypertriglyceridemia, fenofibrate is probably a good option.   Monitored by PCP.

## 2013-12-03 ENCOUNTER — Other Ambulatory Visit: Payer: Self-pay | Admitting: Internal Medicine

## 2013-12-04 NOTE — Telephone Encounter (Signed)
Yes DH 

## 2013-12-13 ENCOUNTER — Telehealth: Payer: Self-pay | Admitting: *Deleted

## 2013-12-13 MED ORDER — NITROGLYCERIN 0.4 MG SL SUBL: 0.4000 mg | SUBLINGUAL_TABLET | SUBLINGUAL | Status: DC | PRN

## 2013-12-13 NOTE — Telephone Encounter (Signed)
Rx was sent to pharmacy electronically. 

## 2013-12-13 NOTE — Telephone Encounter (Signed)
Pharmacy requesting NTG 0.4mg  SL - not on med list

## 2013-12-13 NOTE — Telephone Encounter (Signed)
yES Robert Man, MD

## 2014-03-25 ENCOUNTER — Other Ambulatory Visit: Payer: Self-pay | Admitting: Specialist

## 2014-03-25 DIAGNOSIS — M48061 Spinal stenosis, lumbar region without neurogenic claudication: Secondary | ICD-10-CM

## 2014-04-02 ENCOUNTER — Ambulatory Visit
Admission: RE | Admit: 2014-04-02 | Discharge: 2014-04-02 | Disposition: A | Discharge status: Home / Self Care | Payer: Medicare Other | Source: Ambulatory Visit | Attending: Specialist | Admitting: Specialist

## 2014-04-02 DIAGNOSIS — M48061 Spinal stenosis, lumbar region without neurogenic claudication: Secondary | ICD-10-CM

## 2014-04-02 MED ORDER — IOHEXOL 180 MG/ML  SOLN
15.0000 mL | Freq: Once | INTRAMUSCULAR | Status: AC | PRN
  Administered 2014-04-02: 15 mL via INTRATHECAL

## 2014-04-02 NOTE — Discharge Instructions (Signed)

## 2014-04-02 NOTE — Progress Notes (Signed)
Wife called at 11:50 am. Still waiting for her to pick up pt.

## 2014-04-02 NOTE — Progress Notes (Signed)
No valium per pt request.

## 2014-04-17 ENCOUNTER — Telehealth: Payer: Self-pay | Admitting: Cardiology

## 2014-04-17 NOTE — Telephone Encounter (Signed)
Pt needs a clarence letter for back surgery with Dr Susa Day. Please fax to [phone]

## 2014-04-17 NOTE — Telephone Encounter (Signed)
Informed [name]  CLEARANCE WILL BE HANDLED ON 04/22/14 WHEN DOCTOR RETURNS TO OFFICE VERBALIZED UNDERSTANDING

## 2014-04-24 ENCOUNTER — Telehealth: Payer: Self-pay | Admitting: Cardiology

## 2014-04-24 NOTE — Telephone Encounter (Signed)
Pt is going to have back surgery,he wants to talk to Dr Ellyn Hack about i please. He says he does not need to talk to the nurse.

## 2014-04-24 NOTE — Telephone Encounter (Signed)
Did not want to talk to a nurse. Will route to Dr. Ellyn Hack.

## 2014-04-25 NOTE — Telephone Encounter (Signed)
Late entry On 04/23/14, faxed clearance, low risk surgery per Dr Ellyn Hack.

## 2014-06-06 ENCOUNTER — Other Ambulatory Visit: Payer: Self-pay | Admitting: Dermatology

## 2014-07-24 ENCOUNTER — Other Ambulatory Visit: Payer: Self-pay | Admitting: *Deleted

## 2014-07-24 MED ORDER — NITROGLYCERIN 0.4 MG SL SUBL: 0.4000 mg | SUBLINGUAL_TABLET | SUBLINGUAL | Status: AC | PRN

## 2014-07-24 NOTE — Telephone Encounter (Signed)
Rx(s) sent to pharmacy electronically. Spoke with patient - he has moved to [place], [place] 4 miles outside of [place]   He would like a recommendation of a cardiologist down there, if Dr. Ellyn Hack knows of someone.   Will route message to Dr. Sharlyn Bologna, RN  Chart updated to reflect patient's current address

## 2014-07-25 NOTE — Telephone Encounter (Signed)
Tell him I am bummed that he moved.  I personally do not know any cardiologists in [place] - will ask around.  [place]

## 2014-07-26 ENCOUNTER — Telehealth: Payer: Self-pay | Admitting: Cardiology

## 2014-07-26 NOTE — Telephone Encounter (Signed)
Left message to call back  

## 2014-07-26 NOTE — Telephone Encounter (Signed)
Spoke to patient.  information given to patient - no known cardiologist in [place].

## 2014-07-26 NOTE — Telephone Encounter (Signed)
Returning your call. °
